# Patient Record
Sex: Male | Born: 1955 | Race: Black or African American | Hispanic: No | Marital: Single | Smoking: Never smoker
Health system: Southern US, Community
[De-identification: ages and names within clinical notes are randomized; demographics above are authoritative.]

## PROBLEM LIST (undated history)

## (undated) DIAGNOSIS — I10 Essential (primary) hypertension: Secondary | ICD-10-CM

## (undated) HISTORY — PX: KNEE SURGERY: SHX244

---

## 1999-02-13 ENCOUNTER — Encounter: Payer: Self-pay | Admitting: Family Medicine

## 1999-02-13 ENCOUNTER — Ambulatory Visit (HOSPITAL_COMMUNITY): Admission: RE | Admit: 1999-02-13 | Discharge: 1999-02-13 | Payer: Self-pay | Admitting: Family Medicine

## 2002-03-10 ENCOUNTER — Ambulatory Visit (HOSPITAL_COMMUNITY): Admission: RE | Admit: 2002-03-10 | Discharge: 2002-03-10 | Payer: Self-pay | Admitting: Family Medicine

## 2002-03-10 ENCOUNTER — Encounter: Payer: Self-pay | Admitting: Family Medicine

## 2002-09-27 ENCOUNTER — Ambulatory Visit (HOSPITAL_BASED_OUTPATIENT_CLINIC_OR_DEPARTMENT_OTHER): Admission: RE | Admit: 2002-09-27 | Discharge: 2002-09-27 | Payer: Self-pay | Admitting: Orthopedic Surgery

## 2009-06-13 ENCOUNTER — Emergency Department (HOSPITAL_COMMUNITY): Admission: EM | Admit: 2009-06-13 | Discharge: 2009-06-13 | Payer: Self-pay | Admitting: Emergency Medicine

## 2010-04-16 ENCOUNTER — Emergency Department (HOSPITAL_COMMUNITY): Admission: EM | Admit: 2010-04-16 | Discharge: 2010-04-16 | Payer: Self-pay | Admitting: Emergency Medicine

## 2010-11-05 ENCOUNTER — Ambulatory Visit (HOSPITAL_COMMUNITY)
Admission: RE | Admit: 2010-11-05 | Discharge: 2010-11-05 | Disposition: A | Discharge status: Home / Self Care | Payer: 59 | Source: Ambulatory Visit | Attending: Family Medicine | Admitting: Family Medicine

## 2010-11-05 ENCOUNTER — Inpatient Hospital Stay (INDEPENDENT_AMBULATORY_CARE_PROVIDER_SITE_OTHER)
Admission: RE | Admit: 2010-11-05 | Discharge: 2010-11-05 | Disposition: A | Discharge status: Home / Self Care | Payer: 59 | Source: Ambulatory Visit | Attending: Family Medicine | Admitting: Family Medicine

## 2010-11-05 DIAGNOSIS — M25569 Pain in unspecified knee: Secondary | ICD-10-CM | POA: Insufficient documentation

## 2010-11-05 DIAGNOSIS — M79609 Pain in unspecified limb: Secondary | ICD-10-CM

## 2010-12-15 LAB — POCT URINALYSIS DIP (DEVICE)
Bilirubin Urine: NEGATIVE
Glucose, UA: NEGATIVE mg/dL
Hgb urine dipstick: NEGATIVE
Nitrite: NEGATIVE
Protein, ur: NEGATIVE mg/dL
Specific Gravity, Urine: 1.025 (ref 1.005–1.030)
Urobilinogen, UA: 0.2 mg/dL (ref 0.0–1.0)
pH: 5 (ref 5.0–8.0)

## 2011-02-15 NOTE — Op Note (Signed)
NAME:  CANDEN, CIESLINSKI                        ACCOUNT NO.:  [id number]   MEDICAL RECORD NO.:  [id number]                   PATIENT TYPE:  AMB   LOCATION:  DSC                                  FACILITY:  [facility]   PHYSICIAN:  John L. Rendall III, M.D.           DATE OF BIRTH:  12/22/1955   DATE OF PROCEDURE:  09/27/2002  DATE OF DISCHARGE:                                 OPERATIVE REPORT   PREOPERATIVE DIAGNOSIS:  Quadriceps tendon rupture, left knee.   POSTOPERATIVE DIAGNOSIS:  Quadriceps tendon rupture, left knee.   OPERATION PERFORMED:  Repair of complex quadriceps tendon rupture, left  knee.   SURGEON:  John L. Rendall, M.D.   ANESTHESIA:  General.   INDICATIONS FOR PROCEDURE:  The patient has a quadriceps tendon rupture from  falling down the stairs.  The vastus medius, intermedius and VMO are torn  off the superior and medial portion of the quadriceps.  There is  delamination of the tendons at this point with still some lateral attachment  of the vastus lateralis and the inferior most margin of the vastus medialis.   DESCRIPTION OF PROCEDURE:  Under general anesthesia, the right leg was  prepared with Betadine and draped as a sterile field.  The leg was elevated  and the tourniquet was used at 350 mm.  Midline incision was made  approximately 5 inches in length.  Dissection was carried through skin and  subcutaneous tissue and the seroma that communicates with the knee was  immediately evacuated.  The rip in the tendon was readily identifiable, but  it is slightly atypical instead of being the massive avulsion from the  superior patella.  There is some delamination with some lateral attachment  still present and medial attachments torn loose.  The tendon ends were  freshened with a 10 blade and tongue blade.  The superior pole of the  patella was exposed with rongeur and curets.  Two drill holes were placed.  A #5 Tycron suture was then placed through drill holes in the  superior  patella after weaving it in a modified Bunnell stitch through the tendon  pulling it down to the patella.  This was then tied with a large knot over  the center of the patella.  Medially and laterally with #1 Tycron suture,  the periphery of the tendon tear is repaired requiring about four sutures  medially and about six sutures laterally pulling together the areas of  delamination and making sure they were intact.  The knee was then flexed 45  degrees.  The repair appears solid.  The subcu was then closed with 2-0  Vicryl.  The skin with clips.  Sterile dressing and knee immobilizer were  applied after first infiltrating the wound with Marcaine with morphine with  epinephrine.  The tourniquet was used for 30 minutes.  John L. Dorothyann Gibbs, M.D.    Renato Gails  D:  09/27/2002  T:  09/27/2002  Job:  [id number]

## 2011-04-03 ENCOUNTER — Inpatient Hospital Stay (INDEPENDENT_AMBULATORY_CARE_PROVIDER_SITE_OTHER)
Admission: RE | Admit: 2011-04-03 | Discharge: 2011-04-03 | Disposition: A | Discharge status: Home / Self Care | Payer: Self-pay | Source: Ambulatory Visit | Attending: Emergency Medicine | Admitting: Emergency Medicine

## 2011-04-03 DIAGNOSIS — M899 Disorder of bone, unspecified: Secondary | ICD-10-CM

## 2011-07-23 ENCOUNTER — Emergency Department (HOSPITAL_COMMUNITY): Payer: 59

## 2011-07-23 ENCOUNTER — Emergency Department (HOSPITAL_COMMUNITY)
Admission: EM | Admit: 2011-07-23 | Discharge: 2011-07-24 | Disposition: A | Discharge status: Home / Self Care | Payer: 59 | Attending: Emergency Medicine | Admitting: Emergency Medicine

## 2011-07-23 ENCOUNTER — Inpatient Hospital Stay (INDEPENDENT_AMBULATORY_CARE_PROVIDER_SITE_OTHER)
Admission: RE | Admit: 2011-07-23 | Discharge: 2011-07-23 | Disposition: A | Discharge status: Home / Self Care | Payer: 59 | Source: Ambulatory Visit | Attending: Family Medicine | Admitting: Family Medicine

## 2011-07-23 DIAGNOSIS — Z862 Personal history of diseases of the blood and blood-forming organs and certain disorders involving the immune mechanism: Secondary | ICD-10-CM | POA: Insufficient documentation

## 2011-07-23 DIAGNOSIS — M25569 Pain in unspecified knee: Secondary | ICD-10-CM | POA: Insufficient documentation

## 2011-07-23 DIAGNOSIS — M79609 Pain in unspecified limb: Secondary | ICD-10-CM | POA: Insufficient documentation

## 2011-07-23 DIAGNOSIS — M7989 Other specified soft tissue disorders: Secondary | ICD-10-CM | POA: Insufficient documentation

## 2011-07-23 DIAGNOSIS — Z8639 Personal history of other endocrine, nutritional and metabolic disease: Secondary | ICD-10-CM | POA: Insufficient documentation

## 2011-07-23 LAB — DIFFERENTIAL
Basophils Absolute: 0 10*3/uL (ref 0.0–0.1)
Basophils Relative: 0 % (ref 0–1)
Eosinophils Absolute: 0.3 10*3/uL (ref 0.0–0.7)
Eosinophils Relative: 2 % (ref 0–5)
Lymphocytes Relative: 32 % (ref 12–46)
Lymphs Abs: 4.2 10*3/uL — ABNORMAL HIGH (ref 0.7–4.0)
Monocytes Absolute: 1.1 10*3/uL — ABNORMAL HIGH (ref 0.1–1.0)
Monocytes Relative: 8 % (ref 3–12)
Neutro Abs: 7.6 10*3/uL (ref 1.7–7.7)
Neutrophils Relative %: 58 % (ref 43–77)

## 2011-07-23 LAB — BASIC METABOLIC PANEL
BUN: 12 mg/dL (ref 6–23)
Calcium: 9.3 mg/dL (ref 8.4–10.5)
GFR calc Af Amer: 81 mL/min — ABNORMAL LOW (ref >90)
GFR calc non Af Amer: 70 mL/min — ABNORMAL LOW (ref >90)
Glucose, Bld: 87 mg/dL (ref 70–99)
Sodium: 141 mEq/L (ref 135–145)

## 2011-07-23 LAB — CBC
Hemoglobin: 16 g/dL (ref 13.0–17.0)
MCH: 28.6 pg (ref 26.0–34.0)
MCHC: 37.4 g/dL — ABNORMAL HIGH (ref 30.0–36.0)
RDW: 14.3 % (ref 11.5–15.5)

## 2011-07-23 LAB — PROTIME-INR: INR: 1.07 (ref 0.00–1.49)

## 2011-07-24 ENCOUNTER — Ambulatory Visit (HOSPITAL_COMMUNITY)
Admission: RE | Admit: 2011-07-24 | Discharge: 2011-07-24 | Disposition: A | Discharge status: Home / Self Care | Payer: 59 | Source: Ambulatory Visit | Attending: Emergency Medicine | Admitting: Emergency Medicine

## 2011-07-24 DIAGNOSIS — M79609 Pain in unspecified limb: Secondary | ICD-10-CM

## 2011-07-24 DIAGNOSIS — M7989 Other specified soft tissue disorders: Secondary | ICD-10-CM

## 2012-12-24 ENCOUNTER — Encounter (HOSPITAL_COMMUNITY): Payer: Self-pay | Admitting: *Deleted

## 2012-12-24 ENCOUNTER — Emergency Department (HOSPITAL_COMMUNITY)
Admission: EM | Admit: 2012-12-24 | Discharge: 2012-12-24 | Disposition: A | Discharge status: Home / Self Care | Payer: No Typology Code available for payment source | Source: Home / Self Care

## 2012-12-24 DIAGNOSIS — S39012A Strain of muscle, fascia and tendon of lower back, initial encounter: Secondary | ICD-10-CM

## 2012-12-24 HISTORY — DX: Essential (primary) hypertension: I10

## 2012-12-24 MED ORDER — NAPROXEN 375 MG PO TABS: 375.0000 mg | ORAL_TABLET | Freq: Two times a day (BID) | ORAL | Status: DC

## 2012-12-24 MED ORDER — KETOROLAC TROMETHAMINE 60 MG/2ML IM SOLN
60.0000 mg | Freq: Once | INTRAMUSCULAR | Status: AC
  Administered 2012-12-24: 60 mg via INTRAMUSCULAR

## 2012-12-24 MED ORDER — HYDROCODONE-ACETAMINOPHEN 7.5-325 MG PO TABS: 1.0000 | ORAL_TABLET | ORAL | Status: DC | PRN

## 2012-12-24 MED ORDER — KETOROLAC TROMETHAMINE 60 MG/2ML IM SOLN
INTRAMUSCULAR | Status: AC
  Filled 2012-12-24: qty 2

## 2012-12-24 MED ORDER — CYCLOBENZAPRINE HCL 5 MG PO TABS: 5.0000 mg | ORAL_TABLET | Freq: Three times a day (TID) | ORAL | Status: DC | PRN

## 2012-12-24 NOTE — ED Provider Notes (Signed)
Medical screening examination/treatment/procedure(s) were performed by non-physician practitioner and as supervising physician I was immediately available for consultation/collaboration.  Tracy Fritz   Maguire Sime, MD 12/24/12 1457 

## 2012-12-24 NOTE — ED Notes (Signed)
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  He has had low back pain since then   He has used warm tub soaks and ice packs without relief

## 2012-12-24 NOTE — ED Provider Notes (Signed)
History     CSN: [id number]  Arrival date & time 12/24/12  1117   First MD Initiated Contact with Patient 12/24/12 1303      Chief Complaint  Patient presents with  . 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 Last week when lifting when he experienced pain in his lower right back. The pain has migrated to the left low back as well. He continued to work and lift during this time the pain and now is back is much worse. He is complaining of pain to the bilateral paralumbar musculature. Worse with trying to sit up, went forward or rotate torso. There is no radiation of neuralgia type pain to the legs. He states that he helps.   Past Medical History  Diagnosis Date  . Hypertension     Past Surgical History  Procedure Laterality Date  . Knee surgery      Family History  Problem Relation Age of Onset  . Stroke Mother   . Cancer Father     History  Substance Use Topics  . Smoking status: Never Smoker   . Smokeless tobacco: Not on file  . Alcohol Use: Yes     Comment: occasionally      Review of Systems  Constitutional: Negative.   Respiratory: Negative.   Gastrointestinal: Negative.   Genitourinary: Negative.   Musculoskeletal:       As per HPI  Skin: Negative.   Neurological: Negative for dizziness, weakness, numbness and headaches.    Allergies  Review of patient's allergies indicates no known allergies.  Home Medications   Current Outpatient Rx  Name  Route  Sig  Dispense  Refill  . cyclobenzaprine (FLEXERIL) 5 MG tablet   Oral   Take 1 tablet (5 mg total) by mouth 3 (three) times daily as needed for muscle spasms.   21 tablet   0   . HYDROcodone-acetaminophen (NORCO) 7.5-325 MG per tablet   Oral   Take 1 tablet by mouth every 4 (four) hours as needed for pain.   15 tablet   0   .  naproxen (NAPROSYN) 375 MG tablet   Oral   Take 1 tablet (375 mg total) by mouth 2 (two) times daily.   20 tablet   0     BP 130/80  Pulse 77  Temp(Src) 98 F (36.7 C) (Oral)  Resp 16  SpO2 98%  Physical Exam  Constitutional: He is oriented to person, place, and time. He appears well-developed and well-nourished.  HENT:  Head: Normocephalic and atraumatic.  Eyes: EOM are normal. Left eye exhibits no discharge.  Neck: Normal range of motion. Neck supple.  Musculoskeletal: He exhibits tenderness.  Tenderness to the bilateral lower paralumbar musculature. No spinal tenderness. Able to move all extremities and ambulate with full weightbearing. Patellar DTRs are 1+ bilaterally negative straight leg raises.  Neurological: He is alert and oriented to person, place, and time. No cranial nerve deficit.  Skin: Skin is warm and dry.  Psychiatric: He has a normal mood and affect.    ED Course  Procedures (including critical care time)  Labs Reviewed - No data to display No results found.   1. Lumbosacral strain, initial encounter       MDM  Apply heat to the back several times during the day. No heavy lifting or bending. Out of work for the next  3 days restricted work no lifting over 25 pounds for the next 4 days. Stretches is demonstrated Flexeril 5 mg up to 3 times a day as needed for muscle accident Zocor 7.5 one every 4 hours when necessary pain #15 Naprosyn 375 twice a day p.c. when necessary pain Follow a primary care doctor as needed.     Hayden Rasmussen, NP 12/24/12 1351  Hayden Rasmussen, NP 12/24/12 1351

## 2019-04-15 ENCOUNTER — Other Ambulatory Visit: Payer: Self-pay

## 2019-04-15 ENCOUNTER — Ambulatory Visit (HOSPITAL_COMMUNITY)
Admission: EM | Admit: 2019-04-15 | Discharge: 2019-04-15 | Disposition: A | Discharge status: Other Acute Inpatient Hospital | Payer: No Typology Code available for payment source

## 2019-04-15 ENCOUNTER — Emergency Department (HOSPITAL_COMMUNITY): Payer: No Typology Code available for payment source

## 2019-04-15 ENCOUNTER — Encounter (HOSPITAL_COMMUNITY): Payer: Self-pay | Admitting: Emergency Medicine

## 2019-04-15 ENCOUNTER — Inpatient Hospital Stay (HOSPITAL_COMMUNITY)
Admission: EM | Admit: 2019-04-15 | Discharge: 2019-04-20 | DRG: 177 | Disposition: A | Discharge status: Home / Self Care | Payer: No Typology Code available for payment source | Attending: Internal Medicine | Admitting: Internal Medicine

## 2019-04-15 DIAGNOSIS — U071 COVID-19: Secondary | ICD-10-CM | POA: Diagnosis not present

## 2019-04-15 DIAGNOSIS — N400 Enlarged prostate without lower urinary tract symptoms: Secondary | ICD-10-CM

## 2019-04-15 DIAGNOSIS — E669 Obesity, unspecified: Secondary | ICD-10-CM | POA: Diagnosis not present

## 2019-04-15 DIAGNOSIS — J1289 Other viral pneumonia: Secondary | ICD-10-CM | POA: Diagnosis not present

## 2019-04-15 DIAGNOSIS — M109 Gout, unspecified: Secondary | ICD-10-CM

## 2019-04-15 DIAGNOSIS — R0902 Hypoxemia: Secondary | ICD-10-CM | POA: Diagnosis not present

## 2019-04-15 DIAGNOSIS — J9601 Acute respiratory failure with hypoxia: Secondary | ICD-10-CM | POA: Diagnosis not present

## 2019-04-15 DIAGNOSIS — R3914 Feeling of incomplete bladder emptying: Secondary | ICD-10-CM

## 2019-04-15 DIAGNOSIS — J1282 Pneumonia due to coronavirus disease 2019: Secondary | ICD-10-CM | POA: Diagnosis present

## 2019-04-15 DIAGNOSIS — Z6834 Body mass index (BMI) 34.0-34.9, adult: Secondary | ICD-10-CM | POA: Diagnosis not present

## 2019-04-15 DIAGNOSIS — I10 Essential (primary) hypertension: Secondary | ICD-10-CM | POA: Diagnosis not present

## 2019-04-15 DIAGNOSIS — N401 Enlarged prostate with lower urinary tract symptoms: Secondary | ICD-10-CM | POA: Diagnosis not present

## 2019-04-15 LAB — CBC WITH DIFFERENTIAL/PLATELET
Abs Immature Granulocytes: 1.1 10*3/uL — ABNORMAL HIGH (ref 0.00–0.07)
Band Neutrophils: 5 %
Basophils Absolute: 0 10*3/uL (ref 0.0–0.1)
Basophils Relative: 0 %
Eosinophils Absolute: 0 10*3/uL (ref 0.0–0.5)
Eosinophils Relative: 0 %
HCT: 45.4 % (ref 39.0–52.0)
Hemoglobin: 16 g/dL (ref 13.0–17.0)
Lymphocytes Relative: 28 %
Lymphs Abs: 4.3 10*3/uL — ABNORMAL HIGH (ref 0.7–4.0)
MCH: 28.4 pg (ref 26.0–34.0)
MCHC: 35.2 g/dL (ref 30.0–36.0)
MCV: 80.5 fL (ref 80.0–100.0)
Metamyelocytes Relative: 6 %
Monocytes Absolute: 0.9 10*3/uL (ref 0.1–1.0)
Monocytes Relative: 6 %
Myelocytes: 1 %
Neutro Abs: 9 10*3/uL — ABNORMAL HIGH (ref 1.7–7.7)
Neutrophils Relative %: 54 %
Platelets: 237 10*3/uL (ref 150–400)
RBC: 5.64 MIL/uL (ref 4.22–5.81)
RDW: 14.9 % (ref 11.5–15.5)
WBC: 15.2 10*3/uL — ABNORMAL HIGH (ref 4.0–10.5)
nRBC: 0.6 % — ABNORMAL HIGH (ref 0.0–0.2)

## 2019-04-15 LAB — BASIC METABOLIC PANEL
Anion gap: 14 (ref 5–15)
BUN: 15 mg/dL (ref 8–23)
CO2: 26 mmol/L (ref 22–32)
Calcium: 9.4 mg/dL (ref 8.9–10.3)
Chloride: 99 mmol/L (ref 98–111)
Creatinine, Ser: 1.36 mg/dL — ABNORMAL HIGH (ref 0.61–1.24)
GFR calc Af Amer: >60 mL/min (ref >60)
GFR calc non Af Amer: 55 mL/min — ABNORMAL LOW (ref >60)
Glucose, Bld: 110 mg/dL — ABNORMAL HIGH (ref 70–99)
Potassium: 4.2 mmol/L (ref 3.5–5.1)
Sodium: 139 mmol/L (ref 135–145)

## 2019-04-15 LAB — PROCALCITONIN: Procalcitonin: <0.1 ng/mL

## 2019-04-15 LAB — HEPATIC FUNCTION PANEL
ALT: 53 U/L — ABNORMAL HIGH (ref 0–44)
AST: 52 U/L — ABNORMAL HIGH (ref 15–41)
Albumin: 3 g/dL — ABNORMAL LOW (ref 3.5–5.0)
Alkaline Phosphatase: 56 U/L (ref 38–126)
Bilirubin, Direct: 0.1 mg/dL (ref 0.0–0.2)
Indirect Bilirubin: 0.7 mg/dL (ref 0.3–0.9)
Total Bilirubin: 0.8 mg/dL (ref 0.3–1.2)
Total Protein: 6.7 g/dL (ref 6.5–8.1)

## 2019-04-15 LAB — BRAIN NATRIURETIC PEPTIDE: B Natriuretic Peptide: 26.6 pg/mL (ref 0.0–100.0)

## 2019-04-15 LAB — SARS CORONAVIRUS 2 BY RT PCR (HOSPITAL ORDER, PERFORMED IN ~~LOC~~ HOSPITAL LAB): SARS Coronavirus 2: POSITIVE — AB

## 2019-04-15 LAB — FERRITIN: Ferritin: 1151 ng/mL — ABNORMAL HIGH (ref 24–336)

## 2019-04-15 LAB — TROPONIN I (HIGH SENSITIVITY)
Troponin I (High Sensitivity): 13 ng/L (ref <18)
Troponin I (High Sensitivity): 11 ng/L (ref <18)

## 2019-04-15 LAB — CK: Total CK: 76 U/L (ref 49–397)

## 2019-04-15 LAB — LACTATE DEHYDROGENASE: LDH: 440 U/L — ABNORMAL HIGH (ref 98–192)

## 2019-04-15 LAB — C-REACTIVE PROTEIN: CRP: 7.9 mg/dL — ABNORMAL HIGH (ref <1.0)

## 2019-04-15 LAB — D-DIMER, QUANTITATIVE (NOT AT ARMC): D-Dimer, Quant: 1.92 ug/mL-FEU — ABNORMAL HIGH (ref 0.00–0.50)

## 2019-04-15 MED ORDER — ACETAMINOPHEN 325 MG PO TABS: 650.0000 mg | ORAL_TABLET | Freq: Four times a day (QID) | ORAL | Status: DC | PRN

## 2019-04-15 MED ORDER — SODIUM CHLORIDE 0.9 % IV SOLN
100.0000 mg | INTRAVENOUS | Status: AC
  Administered 2019-04-16 – 2019-04-19 (×4): 100 mg via INTRAVENOUS
  Filled 2019-04-15 (×4): qty 20

## 2019-04-15 MED ORDER — ENSURE ENLIVE PO LIQD
237.0000 mL | Freq: Two times a day (BID) | ORAL | Status: DC
  Administered 2019-04-16 – 2019-04-20 (×8): 237 mL via ORAL

## 2019-04-15 MED ORDER — METHYLPREDNISOLONE SODIUM SUCC 125 MG IJ SOLR
60.0000 mg | Freq: Four times a day (QID) | INTRAMUSCULAR | Status: DC
  Administered 2019-04-15 – 2019-04-17 (×7): 60 mg via INTRAVENOUS
  Filled 2019-04-15 (×7): qty 2

## 2019-04-15 MED ORDER — ENOXAPARIN SODIUM 40 MG/0.4ML ~~LOC~~ SOLN
40.0000 mg | SUBCUTANEOUS | Status: DC
  Administered 2019-04-15: 40 mg via SUBCUTANEOUS
  Filled 2019-04-15: qty 0.4

## 2019-04-15 MED ORDER — SODIUM CHLORIDE 0.9 % IV SOLN
500.0000 mg | Freq: Once | INTRAVENOUS | Status: AC
  Administered 2019-04-15: 500 mg via INTRAVENOUS
  Filled 2019-04-15: qty 500

## 2019-04-15 MED ORDER — SODIUM CHLORIDE 0.9 % IV SOLN
1.0000 g | Freq: Once | INTRAVENOUS | Status: AC
  Administered 2019-04-15: 1 g via INTRAVENOUS
  Filled 2019-04-15: qty 10

## 2019-04-15 MED ORDER — SODIUM CHLORIDE 0.9 % IV SOLN
200.0000 mg | Freq: Once | INTRAVENOUS | Status: AC
  Administered 2019-04-15: 200 mg via INTRAVENOUS
  Filled 2019-04-15: qty 40

## 2019-04-15 NOTE — H&P (Signed)
History and Physical:    Tracy Fritz   ZOX:[id number] DOB: 01/09/1956 DOA: 04/15/2019  Referring MD/provider: Dr Hyacinth MeekerMiller  [facility]   Patient coming from: Home  Chief Complaint: Shortness of breath  History of Present Illness:   Tracy Fritz is an 63 y.o. male with past medical history significant for hypertension who was in his usual state of good health until the day he retired which was 6 days ago when he developed onset of fatigue.  He notes that he was unable to enjoy his food because the food tasted very salty.  Over the next several days patient developed profound anorexia, nonproductive cough and increased weakness About 3 days ago developed shortness of breath.  Patient notes he is very active at baseline however he has been mostly sitting around his house and as of 3 days ago he noted he was short of breath just sitting around.  Of note his partner also has been feeling unwell and she was admitted to the hospital yesterday with pneumonia.  Her COVID test is not yet available.  Patient denies any fevers or chills or sweats.  He denies any chest pain.  Does admit to some difficulty sleeping because he states when he sleeps on his side he feels a heaviness on his chest and has to turn over to the other side for the heaviness to ease off.  Patient notes his cough is nonproductive although he does have some chest discomfort with coughing.  ED Course:  The patient was noted to have a low-grade temperature of 99, oxygen saturation of 75 on room air which improved to 90% on 2 L and 99% on 4 L.  Of note patient does appear to have somewhat of a leukocytosis with WBC 15 with no left shift however of note he had a white count of 13 in 2012 (which is the last blood test we have for him) of his baseline WBC may be somewhat elevated.  [place] appears normal.. X-ray shows bilateral infiltrate.  COVID test is positive.  Procalcitonin is pending.  ROS:   ROS    Review of Systems: General: No fever, chills, weight changes Skin: No rashes, lesions, wounds Endocrine: no heat/cold intolerance, no polyuria Respiratory: Positive cough,, shortness of breath, no hemoptysis Cardiovascular: No palpitations, chest pain GI: No nausea, vomiting, diarrhea, constipation GU: No dysuria, increased frequency CNS: No numbness, dizziness, headache Musculoskeletal: No back pain, joint pain Blood/lymphatics: No easy bruising, bleeding Mood/affect: No anxiety/depression    Past Medical History:   Past Medical History:  Diagnosis Date  . Hypertension     Past Surgical History:   Past Surgical History:  Procedure Laterality Date  . KNEE SURGERY      Social History:   Social History   Socioeconomic History  . Marital status: Single    Spouse name: Not on file  . Number of children: Not on file  . Years of education: Not on file  . Highest education level: Not on file  Occupational History  . Not on file  Social Needs  . Financial resource strain: Not on file  . Food insecurity    Worry: Not on file    Inability: Not on file  . Transportation needs    Medical: Not on file    Non-medical: Not on file  Tobacco Use  . Smoking status: Never Smoker  Substance and Sexual Activity  . Alcohol use: Yes    Comment: occasionally  . Drug use:  No  . Sexual activity: Not on file  Lifestyle  . Physical activity    Days per week: Not on file    Minutes per session: Not on file  . Stress: Not on file  Relationships  . Social Musicianconnections    Talks on phone: Not on file    Gets together: Not on file    Attends religious service: Not on file    Active member of club or organization: Not on file    Attends meetings of clubs or organizations: Not on file    Relationship status: Not on file  . Intimate partner violence    Fear of current or ex partner: Not on file    Emotionally abused: Not on file    Physically abused: Not on file    Forced sexual  activity: Not on file  Other Topics Concern  . Not on file  Social History Narrative  . Not on file    Allergies   Patient has no known allergies.  Family history:   Family History  Problem Relation Age of Onset  . Stroke Mother   . Cancer Father     Current Medications:   Prior to Admission medications   Medication Sig Start Date End Date Taking? Authorizing Provider  PRESCRIPTION MEDICATION Take 1 tablet by mouth daily. Blood pressure medication   Yes [provider]  PRESCRIPTION MEDICATION Take 1 tablet by mouth daily. Gout medication   Yes [provider]  PRESCRIPTION MEDICATION Take 1 tablet by mouth daily. Prostate medication   Yes [provider]    Physical Exam:   Vitals:   04/15/19 1300 04/15/19 1315 04/15/19 1330 04/15/19 1400  BP: 125/86  120/85 126/88  Pulse: 77   80  Resp: (!) 37 (!) 35 (!) 43 (!) 31  Temp:      TempSrc:      SpO2: 93%   (!) (P) 89%  Weight:      Height:         Physical Exam: Blood pressure 126/88, pulse 80, temperature 99 F (37.2 C), temperature source Oral, resp. rate (!) 31, height 5\' 9"  (1.753 m), weight 105.2 kg, SpO2 (!) (P) 89 %. Gen: Relatively well-appearing man sitting up in stretcher in no acute respiratory distress.  He no longer has any tachypnea that was previously described.  He is able to speak in full sentences without difficulty. Eyes: Sclerae anicteric. Conjunctiva mildly injected. Chest: Moderately good air entry bilaterally with rales at bases bilaterally. CV: Distant, regular, no audible murmurs. Abdomen: NABS, soft, nondistended, nontender. No tenderness to light or deep palpation. No rebound, no guarding. Extremities: No edema.  Skin: Warm and dry. No rashes, lesions or wounds. Neuro: Alert and oriented times 3; grossly nonfocal. Psych: Patient is cooperative, logical and coherent with appropriate mood and affect.  Data Review:    Labs: Basic Metabolic Panel: Recent Labs   Lab 04/15/19 1114  NA 139  K 4.2  CL 99  CO2 26  GLUCOSE 110*  BUN 15  CREATININE 1.36*  CALCIUM 9.4   Liver Function Tests: No results for input(s): AST, ALT, ALKPHOS, BILITOT, PROT, ALBUMIN in the last 168 hours. No results for input(s): LIPASE, AMYLASE in the last 168 hours. No results for input(s): AMMONIA in the last 168 hours. CBC: Recent Labs  Lab 04/15/19 1114  WBC 15.2*  NEUTROABS 9.0*  HGB 16.0  HCT 45.4  MCV 80.5  PLT 237   Cardiac Enzymes: Recent Labs  Lab 04/15/19 1222  CKTOTAL 76    BNP (last 3 results) No results for input(s): PROBNP in the last 8760 hours. CBG: No results for input(s): GLUCAP in the last 168 hours.  Urinalysis    Component Value Date/Time   LABSPEC 1.025 04/16/2010 2033   PHURINE 5.0 04/16/2010 2033   GLUCOSEU NEGATIVE 04/16/2010 2033   HGBUR NEGATIVE 04/16/2010 2033   [place] NEGATIVE 04/16/2010 2033   KETONESUR TRACE (A) 04/16/2010 2033   PROTEINUR NEGATIVE 04/16/2010 2033   UROBILINOGEN 0.2 04/16/2010 2033   NITRITE NEGATIVE 04/16/2010 2033   LEUKOCYTESUR  04/16/2010 2033    NEGATIVE Biochemical Testing Only. Please order routine urinalysis from main lab if confirmatory testing is needed.      Radiographic Studies: Dg Chest Port 1 View  Result Date: 04/15/2019 CLINICAL DATA:  Shortness of breath EXAM: PORTABLE CHEST 1 VIEW COMPARISON:  None. FINDINGS: Cardiac shadow is enlarged but accentuated by the portable technique. Mild bibasilar infiltrates are seen. No sizable effusion is noted. No bony abnormality is noted. IMPRESSION: Bibasilar infiltrates. Electronically Signed   By: Inez Catalina M.D.   On: 04/15/2019 12:02    EKG: Independently reviewed.  Sinus rhythm at 75.  Normal intervals.  Normal axis.  T wave inversions 3 and V1 isolated.  No acute ST-T wave changes.     Assessment/Plan:   Principal Problem:   Pneumonia due to COVID-19 virus Active Problems:   Essential hypertension   BPH (benign  prostatic hyperplasia)   Gout  Otherwise relatively healthy 63 year old man presents with COVID-19 pneumonia.  He does have hypoxia on room air and infiltrates on chest x-ray.  COVID 19 PNEUMONIA  We will treat with steroids and Remdesivir Patient received 1 dose of azithromycin and ceftriaxone in the ED Calcitonin is still pending CRP is 8, d-dimer is almost 2.  Of note ferritin is 1151.  HTN We will hold antihypertensives for now until he is adequately fluid resuscitated  BPH Continue Flomax  GOUT No evidence for acute flare    Other information:   DVT prophylaxis: Lovenox ordered. Code Status: Full code. Family Communication: Patient spoke with his partner who is in the hospital Disposition Plan: Home Consults called: None Admission status: Observation  The medical decision making is of moderate complexity, therefore this is a level 2 visit.  Dewaine Oats Tublu Ervey Fallin [facility]  If 7PM-7AM, please contact night-coverage [email or web address] Password [facility] 04/15/2019, 2:46 PM

## 2019-04-15 NOTE — Progress Notes (Signed)
Patient with minimal complaints this evening. Endorses mild SOB and intermittent chest discomfort. VSS, satting 92-94% on 4L [place]. Educated on use of incentive spirometer and flutter valve. Demonstrated use of both instruments and achieved 1000 on IS X3. Educated on importance of proning and patient agreeable to attempt this evening. All questions answered and patient understands plan of care. Called sister Rise Paganini and left voicemail.

## 2019-04-15 NOTE — ED Provider Notes (Signed)
Medical screening examination/treatment/procedure(s) were conducted as a shared visit with non-physician practitioner(s) and myself.  I personally evaluated the patient during the encounter.  Clinical Impression:   Final diagnoses:  Pneumonia due to COVID-19 virus  Hypoxia    The patient is a very pleasant 63 year old male, presents with increasing shortness of breath, tachypnea, coughing after being exposed to his significant other who was recently admitted for pneumonia to an outside hospital.  He is unaware of that person was positive for COVID-19.  On exam the patient does not fact have tachypnea, his oxygen saturations are below 80% on room air however with nasal cannula he he comes up to 89% and feels better.  On exam he does have bilateral rales at the bases and is mildly tachypneic but speaking in short and sentences.  There is no edema of the legs, labs reviewed which are all consistent with coronavirus pneumonia including inflammatory markers.  He will need to be admitted, he will need persistent oxygen therapy to make sure he does not become hypoxic and suffer the sequelae of that.  The patient is critically ill with progressive pneumonia.  He will need to be admitted to a higher level of care at the coronavirus hospital at [facility].  Consultation with hospitalist pending.  Case discussed with hospitalist, Dr. Jamse Arn who will admit the patient to the hospital or transfer based on risk for COVID  CRITICAL CARE Performed by: Johnna Acosta Total critical care time: 35 minutes Critical care time was exclusive of separately billable procedures and treating other patients. Critical care was necessary to treat or prevent imminent or life-threatening deterioration. Critical care was time spent personally by me on the following activities: development of treatment plan with patient and/or surrogate as well as nursing, discussions with consultants, evaluation of patient's response to  treatment, examination of patient, obtaining history from patient or surrogate, ordering and performing treatments and interventions, ordering and review of laboratory studies, ordering and review of radiographic studies, pulse oximetry and re-evaluation of patient's condition.    Noemi Chapel, MD 04/17/19 [phone]

## 2019-04-15 NOTE — ED Notes (Signed)
Patient is being discharged from the [facility] [place] and sent to the Emergency Department via wheelchair by staff. Per Tressia Miners, NP, patient is stable but in need of higher level of care due to desaturation, tachypnea, and shortness of breath. Patient is aware and verbalizes understanding of plan of care.  Vitals:   04/15/19 1027 04/15/19 1032  BP: 132/87   Pulse: 87   Resp: (!) 28   Temp: 98.7 F (37.1 C)   SpO2: (!) 83% 94%

## 2019-04-15 NOTE — Progress Notes (Signed)
Pt arrived to unit at 1828 via [facility], alert and oriented, ambulatory, in no distress. Wearing  at 4L.

## 2019-04-15 NOTE — Progress Notes (Signed)
Pharmacy Note - Remdesivir Dosing  O:  ALT: 53 CXR: bibasilar infiltrates SpO2: 98% on 4L [place]   A/P:  Patient meets criteria for remdesivir.  Begin remdesivir 200 mg IV x 1, followed by 100 mg IV daily x 4 days  Monitor ALT, clinical progress  Peggyann Juba, PharmD, [place] [phone] 04/15/2019 6:25 PM

## 2019-04-15 NOTE — ED Notes (Signed)
O2 nasal canula dropped down to 2lit-will continue to monitor

## 2019-04-15 NOTE — ED Notes (Addendum)
O2 is about 79-83% on room air Patient is placed on 3lit [place] and HOB elevated

## 2019-04-15 NOTE — ED Provider Notes (Signed)
[facility] EMERGENCY DEPARTMENT Provider Note   CSN: [id number] Arrival date & time: 04/15/19  1042    History   Chief Complaint Chief Complaint  Patient presents with  . Shortness of Breath    HPI Tracy Fritz is a 63 y.o. male with a PMH of HTN presenting with constant shortness of breath onset 6 days ago. Patient arrived from urgent care due to low oxygen saturations. Patient reports shortness of breath is worse with laying down and better with ambulation. Patient also reports intermittent right sided non radiating chest pressure and states last episode was today at 4am. Patient reports an intermittent cough and congestion. Patient reports his friend has been sick with pneumonia. Patient denies any recent travel or known COVID-19 exposures. Patient denies fever, nausea, vomiting, leg edema/pain, hx of DVT/PE, or abdominal pain.      HPI  Past Medical History:  Diagnosis Date  . Hypertension     There are no active problems to display for this patient.   Past Surgical History:  Procedure Laterality Date  . KNEE SURGERY          Home Medications    Prior to Admission medications   Not on File    Family History Family History  Problem Relation Age of Onset  . Stroke Mother   . Cancer Father     Social History Social History   Tobacco Use  . Smoking status: Never Smoker  Substance Use Topics  . Alcohol use: Yes    Comment: occasionally  . Drug use: No     Allergies   Patient has no known allergies.   Review of Systems Review of Systems  Constitutional: Negative for chills, diaphoresis, fatigue, fever and unexpected weight change.  HENT: Positive for congestion. Negative for rhinorrhea, sore throat and trouble swallowing.   Eyes: Negative for visual disturbance.  Respiratory: Positive for cough and shortness of breath. Negative for chest tightness, wheezing and stridor.   Cardiovascular: Negative for chest pain,  palpitations and leg swelling.  Gastrointestinal: Negative for abdominal pain, blood in stool, diarrhea, nausea and vomiting.  Endocrine: Negative for cold intolerance and heat intolerance.  Genitourinary: Negative for dysuria.  Musculoskeletal: Negative for myalgias.  Skin: Negative for pallor and rash.  Allergic/Immunologic: Negative for environmental allergies, food allergies and immunocompromised state.  Neurological: Negative for dizziness, syncope, speech difficulty, weakness, light-headedness and numbness.  Psychiatric/Behavioral: The patient is not nervous/anxious.     Physical Exam Updated Vital Signs BP 120/85   Pulse 77   Temp 99 F (37.2 C) (Oral)   Resp (!) 43   Ht 5\' 9"  (1.753 m)   Wt 105.2 kg   SpO2 93%   BMI 34.26 kg/m   Physical Exam Vitals signs and nursing note reviewed.  Constitutional:      General: He is not in acute distress.    Appearance: He is well-developed. He is not diaphoretic.  HENT:     Head: Normocephalic and atraumatic.     Mouth/Throat:     Mouth: Mucous membranes are moist.     Pharynx: Oropharynx is clear. No pharyngeal swelling or oropharyngeal exudate.  Neck:     Musculoskeletal: Normal range of motion and neck supple.     Vascular: No JVD.  Cardiovascular:     Rate and Rhythm: Normal rate and regular rhythm.     Pulses: Normal pulses.          Radial pulses are 2+ on the right side  and 2+ on the left side.       Dorsalis pedis pulses are 2+ on the right side and 2+ on the left side.     Heart sounds: Normal heart sounds. No murmur. No friction rub. No gallop.   Pulmonary:     Effort: Pulmonary effort is normal. Tachypnea present. No accessory muscle usage or respiratory distress.     Breath sounds: Examination of the right-middle field reveals rales. Examination of the left-middle field reveals rales. Examination of the right-lower field reveals rales. Examination of the left-lower field reveals rales. Rales present. No decreased  breath sounds, wheezing or rhonchi.     Comments: Patient is speaking in full sentences without difficulty on 3L of oxygen.  Chest:     Chest wall: No tenderness.  Abdominal:     Palpations: Abdomen is soft.     Tenderness: There is no abdominal tenderness.  Musculoskeletal: Normal range of motion.     Right lower leg: He exhibits no tenderness. No edema.     Left lower leg: He exhibits no tenderness. No edema.  Skin:    General: Skin is warm.     Capillary Refill: Capillary refill takes less than 2 seconds.     Coloration: Skin is not pale.     Findings: No rash.  Neurological:     Mental Status: He is alert.     ED Treatments / Results  Labs (all labs ordered are listed, but only abnormal results are displayed) Labs Reviewed  SARS CORONAVIRUS 2 (HOSPITAL ORDER, [place] [facility]) - Abnormal; Notable for the following components:      Result Value   SARS Coronavirus 2 POSITIVE (*)    All other components within normal limits  BASIC METABOLIC PANEL - Abnormal; Notable for the following components:   Glucose, Bld 110 (*)    Creatinine, Ser 1.36 (*)    GFR calc non Af Amer 55 (*)    All other components within normal limits  CBC WITH DIFFERENTIAL/PLATELET - Abnormal; Notable for the following components:   WBC 15.2 (*)    nRBC 0.6 (*)    Neutro Abs 9.0 (*)    Lymphs Abs 4.3 (*)    Abs Immature Granulocytes 1.10 (*)    All other components within normal limits  LACTATE DEHYDROGENASE - Abnormal; Notable for the following components:   LDH 440 (*)    All other components within normal limits  FERRITIN - Abnormal; Notable for the following components:   Ferritin 1,151 (*)    All other components within normal limits  C-REACTIVE PROTEIN - Abnormal; Notable for the following components:   CRP 7.9 (*)    All other components within normal limits  D-DIMER, QUANTITATIVE (NOT AT [facility]) - Abnormal; Notable for the following components:   D-Dimer, Quant 1.92 (*)     All other components within normal limits  BRAIN NATRIURETIC PEPTIDE  CK  PATHOLOGIST SMEAR REVIEW  TROPONIN I (HIGH SENSITIVITY)  TROPONIN I (HIGH SENSITIVITY)    EKG EKG Interpretation  Date/Time:  Thursday April 15 2019 10:50:54 EDT Ventricular Rate:  83 PR Interval:    QRS Duration: 88 QT Interval:  395 QTC Calculation: 465 R Axis:   -44 Text Interpretation:  Sinus rhythm Left anterior fascicular block No old tracing to compare Confirmed by Noemi Chapel [phone]) on 04/15/2019 10:57:32 AM   Radiology Dg Chest Port 1 View  Result Date: 04/15/2019 CLINICAL DATA:  Shortness of breath EXAM: PORTABLE CHEST  1 VIEW COMPARISON:  None. FINDINGS: Cardiac shadow is enlarged but accentuated by the portable technique. Mild bibasilar infiltrates are seen. No sizable effusion is noted. No bony abnormality is noted. IMPRESSION: Bibasilar infiltrates. Electronically Signed   By: Alcide CleverMark  Lukens M.D.   On: 04/15/2019 12:02    Procedures .Critical Care Performed by: Leretha DykesHernandez, Jenson Beedle P, PA-C Authorized by: Leretha DykesHernandez, Hydeia Mcatee P, PA-C   Critical care provider statement:    Critical care time (minutes):  37   Critical care was time spent personally by me on the following activities:  Discussions with consultants, evaluation of patient's response to treatment, examination of patient, ordering and performing treatments and interventions, ordering and review of laboratory studies, ordering and review of radiographic studies, pulse oximetry, re-evaluation of patient's condition, obtaining history from patient or surrogate and review of old charts   (including critical care time)  Medications Ordered in ED Medications  cefTRIAXone (ROCEPHIN) 1 g in sodium chloride 0.9 % 100 mL IVPB (0 g Intravenous Stopped 04/15/19 1330)  azithromycin (ZITHROMAX) 500 mg in sodium chloride 0.9 % 250 mL IVPB (0 mg Intravenous Stopped 04/15/19 1356)     Initial Impression / Assessment and Plan / ED Course  I have reviewed  the triage vital signs and the nursing notes.  Pertinent labs & imaging results that were available during my care of the patient were reviewed by me and considered in my medical decision making (see chart for details).  Clinical Course as of Apr 14 1408  Thu Apr 15, 2019  1149 Leukocytosis noted over 15.2.   WBC(!): 15.2 [AH]  1210 Bibasilar infiltrates noted on CXR. Images reviewed by me.  DG Chest Port 1 View [AH]  1341 Initial troponin is 13.   Troponin I (High Sensitivity) [AH]  1341 BNP is within normal limits.   Brain natriuretic peptide [AH]  1358 COVID-19 positive.   SARS Coronavirus 2 ([facility] Hosp Order(!) [AH]    Clinical Course User Index [AH] Leretha DykesHernandez, Aul Mangieri P, [place]PA-C      Patient presents with shortness of breath. Vitals, labs, and imaging reviewed. Leukocytosis noted at 15.2. Patient is afebrile. CXR reveals bibasilar infiltrates. Patient has required 3L of oxygen while in the ER. Oxygen saturations dropped to 79% without supplemental oxygen. Provided antibiotics due to pneumonia. COVID-19 test was positive. EKG without acute changes. Troponin is 13. D dimer, CRP, Lactate dehydrogenase, and ferritin elevated consistent with COVID-19. BNP within normal limits. Patient will require admission for COVID-19 pneumonia and hypoxia. Hospitalist was consulted and has agreed to admit patient.   Tracy PouchHerman J Asfaw was evaluated in Emergency Department on 04/15/2019 for the symptoms described in the history of present illness. He was evaluated in the context of the global COVID-19 pandemic, which necessitated consideration that the patient might be at risk for infection with the SARS-CoV-2 virus that causes COVID-19. Institutional protocols and algorithms that pertain to the evaluation of patients at risk for COVID-19 are in a state of rapid change based on information released by regulatory bodies including the CDC and federal and state organizations.  These policies and algorithms were followed during the patient's care in the ED.  Findings and plan of care discussed with supervising physician Dr. Hyacinth MeekerMiller who personally evaluated and examined this patient.  Final Clinical Impressions(s) / ED Diagnoses   Final diagnoses:  Pneumonia due to COVID-19 virus  Hypoxia    ED Discharge Orders    None       Ardyth HarpsHernandez,  Marrianne Moodna P, PA-C 04/15/19 1409    Eber HongMiller, Brian, MD 04/17/19 [phone]

## 2019-04-15 NOTE — Discharge Instructions (Addendum)
Please go to the ER for further evaluation due to low oxygen levels and possible COVID

## 2019-04-15 NOTE — ED Notes (Signed)
[facility] arrived to transport patient

## 2019-04-15 NOTE — ED Triage Notes (Signed)
Pt here for increased SOB x 3 days; pt sts unable to lay flat for sleep; pt noted sats 75% initially rising to 83% on RA with rest; pt tachyonic; pt sts cough and pain with cough; pt placed on 4L with O2 94%

## 2019-04-15 NOTE — ED Provider Notes (Signed)
[place]    CSN: [id number] Arrival date & time: 04/15/19  1696     History   Chief Complaint Chief Complaint  Patient presents with  . Shortness of Breath    HPI Tracy Fritz is a 64 y.o. male.   Patient is a 63 year old male with past medical history of hypertension.  He presents today for increased shortness of breath over the past 3 days.  Reporting he has not felt well since 04/09/19.  He has had mild cough and chest congestion.  He has been unable to lie flat with sleeping.  He has had some dizziness at times and feels like he has to bend over to catch his breath.  Initially upon arrival on room air patient saturations were 75% and then increased to 83% with sitting.  Patient placed on oxygen at 4 L and saturations improved to 94%.  Denies any associated fever, chills, body aches.  He has had some loss of taste and smell.  Unsure of any COVID exposures.    ROS per HPI      Past Medical History:  Diagnosis Date  . Hypertension     There are no active problems to display for this patient.   Past Surgical History:  Procedure Laterality Date  . KNEE SURGERY         Home Medications    Prior to Admission medications   Not on File    Family History Family History  Problem Relation Age of Onset  . Stroke Mother   . Cancer Father     Social History Social History   Tobacco Use  . Smoking status: Never Smoker  Substance Use Topics  . Alcohol use: Yes    Comment: occasionally  . Drug use: No     Allergies   Patient has no known allergies.   Review of Systems Review of Systems   Physical Exam Triage Vital Signs ED Triage Vitals  Enc Vitals Group     BP 04/15/19 1027 132/87     Pulse Rate 04/15/19 1027 87     Resp 04/15/19 1027 (!) 28     Temp 04/15/19 1027 98.7 F (37.1 C)     Temp Source 04/15/19 1027 Oral     SpO2 04/15/19 1027 (!) 83 %     Weight --      Height --      Head Circumference --      Peak Flow --     Pain Score 04/15/19 1028 6     Pain Loc --      Pain Edu? --      Excl. in [place]? --    No data found.  Updated Vital Signs BP 132/87 (BP Location: Right Arm)   Pulse 87   Temp 98.7 F (37.1 C) (Oral)   Resp (!) 28   SpO2 94%   Visual Acuity Right Eye Distance:   Left Eye Distance:   Bilateral Distance:    Right Eye Near:   Left Eye Near:    Bilateral Near:     Physical Exam Vitals signs and nursing note reviewed.  Constitutional:      Appearance: Normal appearance.  HENT:     Head: Normocephalic and atraumatic.     Nose: Nose normal.  Eyes:     Conjunctiva/sclera: Conjunctivae normal.  Neck:     Musculoskeletal: Normal range of motion.  Cardiovascular:     Rate and Rhythm: Normal rate and regular rhythm.  Pulses: Normal pulses.     Heart sounds: Normal heart sounds.  Pulmonary:     Comments: Decreased lung sounds in all fields Musculoskeletal: Normal range of motion.  Skin:    General: Skin is warm and dry.  Neurological:     Mental Status: He is alert.  Psychiatric:        Mood and Affect: Mood normal.      UC Treatments / Results  Labs (all labs ordered are listed, but only abnormal results are displayed) Labs Reviewed - No data to display  EKG   Radiology No results found.  Procedures Procedures (including critical care time)  Medications Ordered in UC Medications - No data to display  Initial Impression / Assessment and Plan / UC Course  I have reviewed the triage vital signs and the nursing notes.  Pertinent labs & imaging results that were available during my care of the patient were reviewed by me and considered in my medical decision making (see chart for details).     Sending patient to the ER for further evaluation of hypoxia and high suspicion for COVID Final Clinical Impressions(s) / UC Diagnoses   Final diagnoses:  None     Discharge Instructions     Please go to the ER for further evaluation due to low oxygen levels  and possible COVID    ED Prescriptions    None     Controlled Substance Prescriptions [place] Controlled Substance Registry consulted? Not Applicable   Janace ArisBast, Briar Sword A, NP 04/15/19 1116

## 2019-04-15 NOTE — ED Triage Notes (Signed)
Patient arrived from Urgent Care with reports that he went to there for Shortness of breathe, but he was told that his O2 level were too low.  PT reports he was having "too many cat naps," unable to sleep flat, felt like he had congestion but "nothing would come up when I cough." All these symptoms have been going on for more than a week.

## 2019-04-15 NOTE — ED Notes (Signed)
PT's O2 sat is between 85-88 on 2 lit-[place] Oxygen titrated up to 4lit [place]

## 2019-04-15 NOTE — ED Notes (Signed)
Got patient undress on the monitor did ekg shown to Dr Miller patient is resting with call bell in reach 

## 2019-04-15 NOTE — ED Notes (Signed)
Attempted Report 

## 2019-04-16 DIAGNOSIS — J9601 Acute respiratory failure with hypoxia: Secondary | ICD-10-CM | POA: Diagnosis present

## 2019-04-16 DIAGNOSIS — J1289 Other viral pneumonia: Secondary | ICD-10-CM | POA: Diagnosis present

## 2019-04-16 DIAGNOSIS — M109 Gout, unspecified: Secondary | ICD-10-CM | POA: Diagnosis present

## 2019-04-16 DIAGNOSIS — N400 Enlarged prostate without lower urinary tract symptoms: Secondary | ICD-10-CM | POA: Diagnosis present

## 2019-04-16 DIAGNOSIS — I10 Essential (primary) hypertension: Secondary | ICD-10-CM | POA: Diagnosis present

## 2019-04-16 DIAGNOSIS — U071 COVID-19: Secondary | ICD-10-CM | POA: Diagnosis present

## 2019-04-16 DIAGNOSIS — R0902 Hypoxemia: Secondary | ICD-10-CM | POA: Diagnosis present

## 2019-04-16 DIAGNOSIS — E669 Obesity, unspecified: Secondary | ICD-10-CM | POA: Diagnosis present

## 2019-04-16 DIAGNOSIS — Z6834 Body mass index (BMI) 34.0-34.9, adult: Secondary | ICD-10-CM | POA: Diagnosis not present

## 2019-04-16 LAB — MAGNESIUM: Magnesium: 2.4 mg/dL (ref 1.7–2.4)

## 2019-04-16 LAB — COMPREHENSIVE METABOLIC PANEL
ALT: 62 U/L — ABNORMAL HIGH (ref 0–44)
AST: 64 U/L — ABNORMAL HIGH (ref 15–41)
Albumin: 3.3 g/dL — ABNORMAL LOW (ref 3.5–5.0)
Alkaline Phosphatase: 56 U/L (ref 38–126)
Anion gap: 12 (ref 5–15)
BUN: 19 mg/dL (ref 8–23)
CO2: 28 mmol/L (ref 22–32)
Calcium: 9.2 mg/dL (ref 8.9–10.3)
Chloride: 99 mmol/L (ref 98–111)
Creatinine, Ser: 0.94 mg/dL (ref 0.61–1.24)
GFR calc Af Amer: >60 mL/min (ref >60)
GFR calc non Af Amer: >60 mL/min (ref >60)
Glucose, Bld: 167 mg/dL — ABNORMAL HIGH (ref 70–99)
Potassium: 4.3 mmol/L (ref 3.5–5.1)
Sodium: 139 mmol/L (ref 135–145)
Total Bilirubin: 0.6 mg/dL (ref 0.3–1.2)
Total Protein: 7.2 g/dL (ref 6.5–8.1)

## 2019-04-16 LAB — CBC WITH DIFFERENTIAL/PLATELET
Abs Immature Granulocytes: 0.7 10*3/uL — ABNORMAL HIGH (ref 0.00–0.07)
Band Neutrophils: 4 %
Basophils Absolute: 0 10*3/uL (ref 0.0–0.1)
Basophils Relative: 0 %
Eosinophils Absolute: 0 10*3/uL (ref 0.0–0.5)
Eosinophils Relative: 0 %
HCT: 42.1 % (ref 39.0–52.0)
Hemoglobin: 14.5 g/dL (ref 13.0–17.0)
Lymphocytes Relative: 6 %
Lymphs Abs: 0.9 10*3/uL (ref 0.7–4.0)
MCH: 28 pg (ref 26.0–34.0)
MCHC: 34.4 g/dL (ref 30.0–36.0)
MCV: 81.3 fL (ref 80.0–100.0)
Metamyelocytes Relative: 3 %
Monocytes Absolute: 0.4 10*3/uL (ref 0.1–1.0)
Monocytes Relative: 3 %
Myelocytes: 2 %
Neutro Abs: 12.4 10*3/uL — ABNORMAL HIGH (ref 1.7–7.7)
Neutrophils Relative %: 82 %
Platelets: 267 10*3/uL (ref 150–400)
RBC: 5.18 MIL/uL (ref 4.22–5.81)
RDW: 14.8 % (ref 11.5–15.5)
WBC: 14.4 10*3/uL — ABNORMAL HIGH (ref 4.0–10.5)
nRBC: 0.2 % (ref 0.0–0.2)

## 2019-04-16 LAB — ABO/RH: ABO/RH(D): A POS

## 2019-04-16 LAB — HIV ANTIBODY (ROUTINE TESTING W REFLEX): HIV Screen 4th Generation wRfx: NONREACTIVE

## 2019-04-16 MED ORDER — ALBUTEROL SULFATE HFA 108 (90 BASE) MCG/ACT IN AERS
2.0000 | INHALATION_SPRAY | Freq: Four times a day (QID) | RESPIRATORY_TRACT | Status: DC | PRN
  Filled 2019-04-16: qty 6.7

## 2019-04-16 MED ORDER — ENOXAPARIN SODIUM 60 MG/0.6ML ~~LOC~~ SOLN
50.0000 mg | SUBCUTANEOUS | Status: DC
  Administered 2019-04-16 – 2019-04-19 (×4): 50 mg via SUBCUTANEOUS
  Filled 2019-04-16 (×4): qty 0.6

## 2019-04-16 MED ORDER — GUAIFENESIN ER 600 MG PO TB12
600.0000 mg | ORAL_TABLET | Freq: Two times a day (BID) | ORAL | Status: DC
  Administered 2019-04-16 – 2019-04-20 (×9): 600 mg via ORAL
  Filled 2019-04-16 (×9): qty 1

## 2019-04-16 MED ORDER — BENZONATATE 100 MG PO CAPS: 100.0000 mg | ORAL_CAPSULE | Freq: Three times a day (TID) | ORAL | Status: DC | PRN

## 2019-04-16 NOTE — Progress Notes (Signed)
Initial Nutrition Assessment  DOCUMENTATION CODES:   Obesity unspecified  INTERVENTION:   Ensure Enlive po BID, each supplement provides 350 kcal and 20 grams of protein  Pt receiving Hormel Shake daily with Breakfast which provides 520 kcals and 22 g of protein and Magic cup BID with lunch and dinner, each supplement provides 290 kcal and 9 grams of protein, automatically on meal trays to optimize nutritional intake.    NUTRITION DIAGNOSIS:   Increased nutrient needs related to acute illness(COVID) as evidenced by estimated needs.  GOAL:   Patient will meet greater than or equal to 90% of their needs  MONITOR:   PO intake, Supplement acceptance  REASON FOR ASSESSMENT:   Malnutrition Screening Tool    ASSESSMENT:   Pt with PMH of HTN who developed fatigue 6 days ago now admitted with SOB and tested positive for COVID.   Pt on 3L O2.   Medications reviewed and include: solumedrol Labs reviewed: Ferritin: 1151 (H)    NUTRITION - FOCUSED PHYSICAL EXAM:  Deferred   Diet Order:   Diet Order            Diet regular Room service appropriate? Yes; Fluid consistency: Thin  Diet effective now              EDUCATION NEEDS:   No education needs have been identified at this time  Skin:  Skin Assessment: Reviewed RN Assessment  Last BM:  unknown  Height:   Ht Readings from Last 1 Encounters:  04/15/19 5\' 9"  (1.753 m)    Weight:   Wt Readings from Last 1 Encounters:  04/15/19 105.2 kg    Ideal Body Weight:  72.7 kg  BMI:  Body mass index is 34.26 kg/m.  Estimated Nutritional Needs:   Kcal:  2200-2400  Protein:  105-115 grams  Fluid:  > 2L/day  Maylon Peppers RD, [place], [place] Pager [phone] After Hours Pager

## 2019-04-16 NOTE — Progress Notes (Signed)
[facility] Progress Note  Patient: Tracy PouchHerman J Fritz JYN:[id number]   [facility] DOB: May 12, 1956   DOA: 04/15/2019   DOS: 04/16/2019   Date of Service: the patient was seen and examined on 04/16/2019  Brief hospital course: Pt. with PMH of HTN; admitted on 04/15/2019, presented with complaint of cough and shortness of breath, was found to have acute COVID-19 related to viral illness with respiratory failure. Currently further plan is continue current treatment.  Subjective: Still has shortness of breath as well as cough.  Still feels fatigued.  Significant improvement reported as compared to yesterday.  No nausea no vomiting no diarrhea.  Assessment and Plan: 1.  Acute COVID-19 Viral illness Lab Results  Component Value Date   SARSCOV2NAA POSITIVE (A) 04/15/2019   CXR: hazy bilateral peripheral opacities  Recent Labs    04/15/19 1222  DDIMER 1.92*  FERRITIN 1,151*  LDH 440*  CRP 7.9*   Tmax last 24 hours: 99 Oxygen requirements: 3 LPM  Antibiotics: None Diuretics: None Vitamin C and Zinc: Continue DVT Prophylaxis: Subcutaneous Lovenox weight based. Remdesivir: Started on 04/15/2019 Steroids: Solu-Medrol IV started on 04/15/2019 Actemra: Currently no indication for off-label use of Actemra:  Prone positioning: Patient encouraged to stay in prone position as much as possible.  PPE During this encounter: Patient Isolation: Airborne + Droplet + Contact HCP PPE: CAPR, gown. gloves Patient PPE: None  The treatment plan and use of medications and known side effects were discussed with patient/family. It was clearly explained that there is no proven definitive treatment for COVID-19 infection yet. Any medications used here are based on case reports/anecdotal data which are not peer-reviewed and has not been studied using randomized control trials.  Complete risks and long-term side effects are unknown, however in the best clinical judgment they seem to be of  some clinical benefit rather than medical risks.  Patient/family agree with the treatment plan and want to receive these treatments as indicated.  2.  Essential hypertension. Blood pressure stable. Currently holding.  3.  BPH. Continue Flomax.  4. History of gout. Currently no evidence of flare. Monitor.  5.  Obesity Body mass index is 34.26 kg/m.  Nutrition Problem: Increased nutrient needs Etiology: acute illness(COVID) Interventions: Interventions: Refer to RD note for recommendations  Diet: Cardiac diet DVT Prophylaxis: Subcutaneous Lovenox  Advance goals of care discussion: Full code  Family Communication: no family was present at bedside, at the time of interview. The pt provided permission to discuss medical plan with the family. Opportunity was given to ask question and all questions were answered satisfactorily.   Disposition:  Discharge to Home .  Consultants: none Procedures: none  Scheduled Meds: . enoxaparin (LOVENOX) injection  50 mg Subcutaneous Q24H  . feeding supplement (ENSURE ENLIVE)  237 mL Oral BID BM  . guaiFENesin  600 mg Oral BID  . methylPREDNISolone (SOLU-MEDROL) injection  60 mg Intravenous Q6H   Continuous Infusions: . remdesivir 100 mg in NS 250 mL     PRN Meds: acetaminophen, albuterol, benzonatate Antibiotics: Anti-infectives (From admission, onward)   Start     Dose/Rate Route Frequency Ordered Stop   04/16/19 2000  remdesivir 100 mg in sodium chloride 0.9 % 250 mL IVPB     100 mg 500 mL/hr over 30 Minutes Intravenous Every 24 hours 04/15/19 1826 04/20/19 1959   04/15/19 2000  remdesivir 200 mg in sodium chloride 0.9 % 250 mL IVPB     200 mg 500 mL/hr over 30 Minutes Intravenous Once 04/15/19  1826 04/15/19 2044   04/15/19 1215  cefTRIAXone (ROCEPHIN) 1 g in sodium chloride 0.9 % 100 mL IVPB     1 g 200 mL/hr over 30 Minutes Intravenous  Once 04/15/19 1204 04/15/19 1330   04/15/19 1215  azithromycin (ZITHROMAX) 500 mg in sodium  chloride 0.9 % 250 mL IVPB     500 mg 250 mL/hr over 60 Minutes Intravenous  Once 04/15/19 1204 04/15/19 1356       Objective: Physical Exam: Vitals:   04/15/19 2115 04/16/19 0000 04/16/19 0409 04/16/19 0902  BP: 131/84  125/89 117/78  Pulse: 84 77 92 78  Resp: (!) 25 (!) 22 (!) 24 (!) 22  Temp: 98 F (36.7 C)  98 F (36.7 C) (!) 97.5 F (36.4 C)  TempSrc: Oral  Oral Oral  SpO2: 93% 93% 93% 95%  Weight:      Height:        Intake/Output Summary (Last 24 hours) at 04/16/2019 1808 Last data filed at 04/15/2019 2300 Gross per 24 hour  Intake 521.53 ml  Output -  Net 521.53 ml   Filed Weights   04/15/19 1053  Weight: 105.2 kg   General: alert and oriented to time, place, and person. Appear in moderate distress, affect appropriate Eyes: PERRL, Conjunctiva normal ENT: Oral Mucosa Clear, moist  Neck: no JVD, no Abnormal Mass Or lumps Cardiovascular: S1 and S2 Present, no Murmur, peripheral pulses symmetrical Respiratory: normal respiratory effort, Bilateral Air entry equal and Decreased, no use of accessory muscle, bilateral  Crackles, no wheezes Abdomen: Bowel Sound present, Soft and no tenderness, no hernia Skin: no rashes  Extremities: bilateral  Pedal edema, no calf tenderness Neurologic: normal without focal findings, mental status, speech normal, alert and oriented x3, PERLA, Motor strength 5/5 and symmetric and sensation grossly normal to light touch Gait not checked due to patient safety concerns  Data Reviewed: CBC: Recent Labs  Lab 04/15/19 1114 04/16/19 0200  WBC 15.2* 14.4*  NEUTROABS 9.0* 12.4*  HGB 16.0 14.5  HCT 45.4 42.1  MCV 80.5 81.3  PLT 237 448   Basic Metabolic Panel: Recent Labs  Lab 04/15/19 1114 04/16/19 0200  NA 139 139  K 4.2 4.3  CL 99 99  CO2 26 28  GLUCOSE 110* 167*  BUN 15 19  CREATININE 1.36* 0.94  CALCIUM 9.4 9.2  MG  --  2.4    Liver Function Tests: Recent Labs  Lab 04/15/19 1310 04/16/19 0200  AST 52* 64*  ALT  53* 62*  ALKPHOS 56 56  BILITOT 0.8 0.6  PROT 6.7 7.2  ALBUMIN 3.0* 3.3*   No results for input(s): LIPASE, AMYLASE in the last 168 hours. No results for input(s): AMMONIA in the last 168 hours. Coagulation Profile: No results for input(s): INR, PROTIME in the last 168 hours. Cardiac Enzymes: Recent Labs  Lab 04/15/19 1222  CKTOTAL 76   BNP (last 3 results) No results for input(s): PROBNP in the last 8760 hours. CBG: No results for input(s): GLUCAP in the last 168 hours. Studies: No results found.   Time spent: 35 minutes  Author: Berle Mull, MD [facility] 04/16/2019 6:08 PM  To reach On-call, see care teams to locate the attending and reach out to them via [email or web address]. If 7PM-7AM, please contact night-coverage If you still have difficulty reaching the attending provider, please page the [facility] (Director on Call) for [facility] on amion for assistance.

## 2019-04-16 NOTE — Progress Notes (Signed)
Ok to increase lovenox to 0.5mg /kg/day per Dr. Posey Pronto.  Lovenox 50mg  SQ qday  Onnie Boer, PharmD, [place], AAHIVP, CPP Infectious Disease Pharmacist 04/16/2019 9:51 AM

## 2019-04-17 LAB — COMPREHENSIVE METABOLIC PANEL
ALT: 77 U/L — ABNORMAL HIGH (ref 0–44)
AST: 49 U/L — ABNORMAL HIGH (ref 15–41)
Albumin: 3.1 g/dL — ABNORMAL LOW (ref 3.5–5.0)
Alkaline Phosphatase: 57 U/L (ref 38–126)
Anion gap: 9 (ref 5–15)
BUN: 26 mg/dL — ABNORMAL HIGH (ref 8–23)
CO2: 27 mmol/L (ref 22–32)
Calcium: 9.4 mg/dL (ref 8.9–10.3)
Chloride: 104 mmol/L (ref 98–111)
Creatinine, Ser: 1.04 mg/dL (ref 0.61–1.24)
GFR calc Af Amer: >60 mL/min (ref >60)
GFR calc non Af Amer: >60 mL/min (ref >60)
Glucose, Bld: 142 mg/dL — ABNORMAL HIGH (ref 70–99)
Potassium: 4.3 mmol/L (ref 3.5–5.1)
Sodium: 140 mmol/L (ref 135–145)
Total Bilirubin: 0.3 mg/dL (ref 0.3–1.2)
Total Protein: 6.9 g/dL (ref 6.5–8.1)

## 2019-04-17 LAB — CBC WITH DIFFERENTIAL/PLATELET
Abs Immature Granulocytes: 1.04 10*3/uL — ABNORMAL HIGH (ref 0.00–0.07)
Basophils Absolute: 0.1 10*3/uL (ref 0.0–0.1)
Basophils Relative: 0 %
Eosinophils Absolute: 0 10*3/uL (ref 0.0–0.5)
Eosinophils Relative: 0 %
HCT: 41.4 % (ref 39.0–52.0)
Hemoglobin: 14.7 g/dL (ref 13.0–17.0)
Immature Granulocytes: 4 %
Lymphocytes Relative: 6 %
Lymphs Abs: 1.4 10*3/uL (ref 0.7–4.0)
MCH: 28.7 pg (ref 26.0–34.0)
MCHC: 35.5 g/dL (ref 30.0–36.0)
MCV: 80.9 fL (ref 80.0–100.0)
Monocytes Absolute: 1.5 10*3/uL — ABNORMAL HIGH (ref 0.1–1.0)
Monocytes Relative: 6 %
Neutro Abs: 22 10*3/uL — ABNORMAL HIGH (ref 1.7–7.7)
Neutrophils Relative %: 84 %
Platelets: 308 10*3/uL (ref 150–400)
RBC: 5.12 MIL/uL (ref 4.22–5.81)
RDW: 14.7 % (ref 11.5–15.5)
WBC: 26 10*3/uL — ABNORMAL HIGH (ref 4.0–10.5)
nRBC: 0.1 % (ref 0.0–0.2)

## 2019-04-17 LAB — MAGNESIUM: Magnesium: 2.4 mg/dL (ref 1.7–2.4)

## 2019-04-17 MED ORDER — DEXAMETHASONE SODIUM PHOSPHATE 10 MG/ML IJ SOLN
6.0000 mg | Freq: Every day | INTRAMUSCULAR | Status: DC
  Administered 2019-04-17: 6 mg via INTRAVENOUS
  Filled 2019-04-17: qty 1

## 2019-04-17 NOTE — Progress Notes (Signed)
[facility] Progress Note  Patient: Tracy PouchHerman J Nazzaro ZHY:[id number]   PCP: [facility] DOB: 03/14/1956   DOA: 04/15/2019   DOS: 04/17/2019   Date of Service: the patient was seen and examined on 04/17/2019  Brief hospital course: Pt. with PMH of HTN; admitted on 04/15/2019, presented with complaint of cough and shortness of breath, was found to have acute COVID-19 related to viral illness with respiratory failure. Currently further plan is continue current treatment.  Subjective: Some improvement in breathing.  No nausea no vomiting.  Still has cough.  No diarrhea reported.  Oral intake improving.  Assessment and Plan: 1. Acute COVID-19 Viral illness Acute hypoxic respiratory failure. Lab Results  Component Value Date   SARSCOV2NAA POSITIVE (A) 04/15/2019   CXR: hazy bilateral peripheral opacities  Recent Labs    04/15/19 1222  DDIMER 1.92*  FERRITIN 1,151*  LDH 440*  CRP 7.9*   Tmax last 24 hours: 99 Oxygen requirements: 3 LPM  Antibiotics: None Diuretics: None Vitamin C and Zinc: Continue DVT Prophylaxis: Subcutaneous Lovenox weight based. Remdesivir: Started on 04/15/2019 Steroids: Solu-Medrol IV started on 04/15/2019 Actemra: Currently no indication for off-label use of Actemra:  Prone positioning: Patient encouraged to stay in prone position as much as possible.  PPE During this encounter: Patient Isolation: Airborne + Droplet + Contact HCP PPE: CAPR, gown. gloves Patient PPE: None  The treatment plan and use of medications and known side effects were discussed with patient/family. It was clearly explained that there is no proven definitive treatment for COVID-19 infection yet. Any medications used here are based on case reports/anecdotal data which are not peer-reviewed and has not been studied using randomized control trials.  Complete risks and long-term side effects are unknown, however in the best clinical judgment they seem to be of some  clinical benefit rather than medical risks.  Patient/family agree with the treatment plan and want to receive these treatments as indicated.  2.  Essential hypertension. Blood pressure stable. Currently holding.  3.  BPH. Continue Flomax.  4. History of gout. Currently no evidence of flare. Monitor.  5.  Obesity Body mass index is 34.26 kg/m.  Nutrition Problem: Increased nutrient needs Etiology: acute illness(COVID) Interventions: Interventions: Refer to RD note for recommendations  Diet: Cardiac diet DVT Prophylaxis: Subcutaneous Lovenox  Advance goals of care discussion: Full code  Family Communication: no family was present at bedside, at the time of interview. The pt provided permission to discuss medical plan with the family. Opportunity was given to ask question and all questions were answered satisfactorily.   Disposition:  Discharge to Home .  Consultants: none Procedures: none  Scheduled Meds: . dexamethasone (DECADRON) injection  6 mg Intravenous Daily  . enoxaparin (LOVENOX) injection  50 mg Subcutaneous Q24H  . feeding supplement (ENSURE ENLIVE)  237 mL Oral BID BM  . guaiFENesin  600 mg Oral BID   Continuous Infusions: . remdesivir 100 mg in NS 250 mL 100 mg (04/16/19 2110)   PRN Meds: acetaminophen, albuterol, benzonatate Antibiotics: Anti-infectives (From admission, onward)   Start     Dose/Rate Route Frequency Ordered Stop   04/16/19 2000  remdesivir 100 mg in sodium chloride 0.9 % 250 mL IVPB     100 mg 500 mL/hr over 30 Minutes Intravenous Every 24 hours 04/15/19 1826 04/20/19 1959   04/15/19 2000  remdesivir 200 mg in sodium chloride 0.9 % 250 mL IVPB     200 mg 500 mL/hr over 30 Minutes Intravenous Once 04/15/19 1826 04/15/19  2044   04/15/19 1215  cefTRIAXone (ROCEPHIN) 1 g in sodium chloride 0.9 % 100 mL IVPB     1 g 200 mL/hr over 30 Minutes Intravenous  Once 04/15/19 1204 04/15/19 1330   04/15/19 1215  azithromycin (ZITHROMAX) 500 mg in  sodium chloride 0.9 % 250 mL IVPB     500 mg 250 mL/hr over 60 Minutes Intravenous  Once 04/15/19 1204 04/15/19 1356       Objective: Physical Exam: Vitals:   04/17/19 0822 04/17/19 1200 04/17/19 1400 04/17/19 1658  BP: 124/79   118/62  Pulse: (!) 56 65 76 (!) 54  Resp: 17 20 17  (!) 22  Temp: 98.1 F (36.7 C)   97.6 F (36.4 C)  TempSrc: Oral   Oral  SpO2: 96% 95% 95% 93%  Weight:      Height:        Intake/Output Summary (Last 24 hours) at 04/17/2019 1808 Last data filed at 04/17/2019 1300 Gross per 24 hour  Intake 720 ml  Output 1100 ml  Net -380 ml   Filed Weights   04/15/19 1053  Weight: 105.2 kg   General: alert and oriented to time, place, and person. Appear in moderate distress, affect appropriate Eyes: PERRL, Conjunctiva normal ENT: Oral Mucosa Clear, moist  Neck: no JVD, no Abnormal Mass Or lumps Cardiovascular: S1 and S2 Present, no Murmur, peripheral pulses symmetrical Respiratory: normal respiratory effort, Bilateral Air entry equal and Decreased, no use of accessory muscle, bilateral  Crackles, no wheezes Abdomen: Bowel Sound present, Soft and no tenderness, no hernia Skin: no rashes  Extremities: bilateral  Pedal edema, no calf tenderness Neurologic: normal without focal findings, mental status, speech normal, alert and oriented x3, PERLA, Motor strength 5/5 and symmetric and sensation grossly normal to light touch Gait not checked due to patient safety concerns  Data Reviewed: CBC: Recent Labs  Lab 04/15/19 1114 04/16/19 0200 04/17/19 0420  WBC 15.2* 14.4* 26.0*  NEUTROABS 9.0* 12.4* 22.0*  HGB 16.0 14.5 14.7  HCT 45.4 42.1 41.4  MCV 80.5 81.3 80.9  PLT 237 267 341   Basic Metabolic Panel: Recent Labs  Lab 04/15/19 1114 04/16/19 0200 04/17/19 0420  NA 139 139 140  K 4.2 4.3 4.3  CL 99 99 104  CO2 26 28 27   GLUCOSE 110* 167* 142*  BUN 15 19 26*  CREATININE 1.36* 0.94 1.04  CALCIUM 9.4 9.2 9.4  MG  --  2.4 2.4    Liver Function  Tests: Recent Labs  Lab 04/15/19 1310 04/16/19 0200 04/17/19 0420  AST 52* 64* 49*  ALT 53* 62* 77*  ALKPHOS 56 56 57  BILITOT 0.8 0.6 0.3  PROT 6.7 7.2 6.9  ALBUMIN 3.0* 3.3* 3.1*   No results for input(s): LIPASE, AMYLASE in the last 168 hours. No results for input(s): AMMONIA in the last 168 hours. Coagulation Profile: No results for input(s): INR, PROTIME in the last 168 hours. Cardiac Enzymes: Recent Labs  Lab 04/15/19 1222  CKTOTAL 76   BNP (last 3 results) No results for input(s): PROBNP in the last 8760 hours. CBG: No results for input(s): GLUCAP in the last 168 hours. Studies: No results found.   Time spent: 35 minutes  Author: Berle Mull, MD [facility] 04/17/2019 6:08 PM  To reach On-call, see care teams to locate the attending and reach out to them via [email or web address]. If 7PM-7AM, please contact night-coverage If you still have difficulty reaching the attending provider, please page the [facility] (Director on  Call) for [facility] on amion for assistance.

## 2019-04-17 NOTE — Progress Notes (Signed)
The pt's primary contact, Marena Chancy, was called and provided with a daily update. Any questions or concerns were addressed.

## 2019-04-18 ENCOUNTER — Encounter (HOSPITAL_COMMUNITY): Payer: Self-pay

## 2019-04-18 LAB — CBC WITH DIFFERENTIAL/PLATELET
Abs Immature Granulocytes: 0.35 10*3/uL — ABNORMAL HIGH (ref 0.00–0.07)
Basophils Absolute: 0.1 10*3/uL (ref 0.0–0.1)
Basophils Relative: 0 %
Eosinophils Absolute: 0 10*3/uL (ref 0.0–0.5)
Eosinophils Relative: 0 %
HCT: 38.2 % — ABNORMAL LOW (ref 39.0–52.0)
Hemoglobin: 13.3 g/dL (ref 13.0–17.0)
Immature Granulocytes: 1 %
Lymphocytes Relative: 6 %
Lymphs Abs: 1.5 10*3/uL (ref 0.7–4.0)
MCH: 28.1 pg (ref 26.0–34.0)
MCHC: 34.8 g/dL (ref 30.0–36.0)
MCV: 80.8 fL (ref 80.0–100.0)
Monocytes Absolute: 2.3 10*3/uL — ABNORMAL HIGH (ref 0.1–1.0)
Monocytes Relative: 9 %
Neutro Abs: 20.8 10*3/uL — ABNORMAL HIGH (ref 1.7–7.7)
Neutrophils Relative %: 84 %
Platelets: 299 10*3/uL (ref 150–400)
RBC: 4.73 MIL/uL (ref 4.22–5.81)
RDW: 14.8 % (ref 11.5–15.5)
WBC: 25 10*3/uL — ABNORMAL HIGH (ref 4.0–10.5)
nRBC: 0 % (ref 0.0–0.2)

## 2019-04-18 LAB — COMPREHENSIVE METABOLIC PANEL
ALT: 61 U/L — ABNORMAL HIGH (ref 0–44)
AST: 29 U/L (ref 15–41)
Albumin: 2.9 g/dL — ABNORMAL LOW (ref 3.5–5.0)
Alkaline Phosphatase: 53 U/L (ref 38–126)
Anion gap: 7 (ref 5–15)
BUN: 26 mg/dL — ABNORMAL HIGH (ref 8–23)
CO2: 29 mmol/L (ref 22–32)
Calcium: 8.8 mg/dL — ABNORMAL LOW (ref 8.9–10.3)
Chloride: 105 mmol/L (ref 98–111)
Creatinine, Ser: 0.97 mg/dL (ref 0.61–1.24)
GFR calc Af Amer: >60 mL/min (ref >60)
GFR calc non Af Amer: >60 mL/min (ref >60)
Glucose, Bld: 136 mg/dL — ABNORMAL HIGH (ref 70–99)
Potassium: 3.8 mmol/L (ref 3.5–5.1)
Sodium: 141 mmol/L (ref 135–145)
Total Bilirubin: 0.3 mg/dL (ref 0.3–1.2)
Total Protein: 6 g/dL — ABNORMAL LOW (ref 6.5–8.1)

## 2019-04-18 LAB — D-DIMER, QUANTITATIVE: D-Dimer, Quant: 0.71 ug/mL-FEU — ABNORMAL HIGH (ref 0.00–0.50)

## 2019-04-18 LAB — FERRITIN: Ferritin: 790 ng/mL — ABNORMAL HIGH (ref 24–336)

## 2019-04-18 LAB — C-REACTIVE PROTEIN: CRP: 1.2 mg/dL — ABNORMAL HIGH (ref <1.0)

## 2019-04-18 LAB — MAGNESIUM: Magnesium: 2.3 mg/dL (ref 1.7–2.4)

## 2019-04-18 MED ORDER — FAMOTIDINE 20 MG PO TABS
20.0000 mg | ORAL_TABLET | Freq: Every day | ORAL | Status: DC
  Administered 2019-04-18 – 2019-04-20 (×3): 20 mg via ORAL
  Filled 2019-04-18 (×3): qty 1

## 2019-04-18 MED ORDER — ALUM & MAG HYDROXIDE-SIMETH 200-200-20 MG/5ML PO SUSP: 15.0000 mL | ORAL | Status: DC | PRN

## 2019-04-18 MED ORDER — DEXAMETHASONE 6 MG PO TABS
6.0000 mg | ORAL_TABLET | Freq: Every day | ORAL | Status: DC
  Administered 2019-04-18 – 2019-04-20 (×3): 6 mg via ORAL
  Filled 2019-04-18 (×3): qty 1

## 2019-04-18 NOTE — Progress Notes (Signed)
[facility] Progress Note  Patient: Tracy PouchHerman J Karle FAO:[id number]   PCP: [facility] DOB: 09/20/1956   DOA: 04/15/2019   DOS: 04/18/2019   Date of Service: the patient was seen and examined on 04/18/2019  Brief hospital course: Pt. with PMH of HTN; admitted on 04/15/2019, presented with complaint of cough and shortness of breath, was found to have acute COVID-19 related to viral illness with respiratory failure. Currently further plan is continue current treatment.  Subjective: Denies any acute complaint.  Cough still present.  Breathing improving.  No nausea no vomiting.  Assessment and Plan: 1. Acute COVID-19 Viral illness Acute hypoxic respiratory failure. Lab Results  Component Value Date   SARSCOV2NAA POSITIVE (A) 04/15/2019   CXR: hazy bilateral peripheral opacities  Recent Labs    04/18/19 0441  DDIMER 0.71*  FERRITIN 790*  CRP 1.2*   Tmax last 24 hours: 98.3 Oxygen requirements: On room air  Antibiotics: None Diuretics: None Vitamin C and Zinc: Continue DVT Prophylaxis: Subcutaneous Lovenox weight based. Remdesivir: Started on 04/15/2019 Steroids: Solu-Medrol IV started on 04/15/2019, switched to Decadron Actemra: Currently no indication for off-label use of Actemra:  Prone positioning: Patient encouraged to stay in prone position as much as possible.  PPE During this encounter: Patient Isolation: Airborne + Droplet + Contact HCP PPE: CAPR, gown. gloves Patient PPE: None  The treatment plan and use of medications and known side effects were discussed with patient/family. It was clearly explained that there is no proven definitive treatment for COVID-19 infection yet. Any medications used here are based on case reports/anecdotal data which are not peer-reviewed and has not been studied using randomized control trials.  Complete risks and long-term side effects are unknown, however in the best clinical judgment they seem to be of some clinical  benefit rather than medical risks.  Patient/family agree with the treatment plan and want to receive these treatments as indicated.  2.  Essential hypertension. Blood pressure stable. Currently holding.  3.  BPH. Continue Flomax.  4. History of gout. Currently no evidence of flare. Monitor.  5.  Obesity Body mass index is 34.26 kg/m.  Nutrition Problem: Increased nutrient needs Etiology: acute illness(COVID) Interventions: Interventions: Refer to RD note for recommendations  Diet: Cardiac diet DVT Prophylaxis: Subcutaneous Lovenox  Advance goals of care discussion: Full code  Family Communication: no family was present at bedside, at the time of interview. The pt provided permission to discuss medical plan with the family. Opportunity was given to ask question and all questions were answered satisfactorily.   Disposition:  Discharge to Home .  Consultants: none Procedures: none  Scheduled Meds: . dexamethasone  6 mg Oral Q breakfast  . enoxaparin (LOVENOX) injection  50 mg Subcutaneous Q24H  . famotidine  20 mg Oral Daily  . feeding supplement (ENSURE ENLIVE)  237 mL Oral BID BM  . guaiFENesin  600 mg Oral BID   Continuous Infusions: . remdesivir 100 mg in NS 250 mL 100 mg (04/17/19 2059)   PRN Meds: acetaminophen, albuterol, alum & mag hydroxide-simeth, benzonatate Antibiotics: Anti-infectives (From admission, onward)   Start     Dose/Rate Route Frequency Ordered Stop   04/16/19 2000  remdesivir 100 mg in sodium chloride 0.9 % 250 mL IVPB     100 mg 500 mL/hr over 30 Minutes Intravenous Every 24 hours 04/15/19 1826 04/20/19 1959   04/15/19 2000  remdesivir 200 mg in sodium chloride 0.9 % 250 mL IVPB     200 mg 500 mL/hr over  30 Minutes Intravenous Once 04/15/19 1826 04/15/19 2044   04/15/19 1215  cefTRIAXone (ROCEPHIN) 1 g in sodium chloride 0.9 % 100 mL IVPB     1 g 200 mL/hr over 30 Minutes Intravenous  Once 04/15/19 1204 04/15/19 1330   04/15/19 1215   azithromycin (ZITHROMAX) 500 mg in sodium chloride 0.9 % 250 mL IVPB     500 mg 250 mL/hr over 60 Minutes Intravenous  Once 04/15/19 1204 04/15/19 1356       Objective: Physical Exam: Vitals:   04/17/19 2000 04/17/19 2001 04/18/19 0430 04/18/19 0825  BP: 115/72 115/72 119/73 116/74  Pulse: 69   (!) 40  Resp: (!) 31   (!) 24  Temp:  98.3 F (36.8 C) 97.9 F (36.6 C) 97.8 F (36.6 C)  TempSrc:  Oral Oral Oral  SpO2: 97%   97%  Weight:      Height:        Intake/Output Summary (Last 24 hours) at 04/18/2019 1744 Last data filed at 04/18/2019 1400 Gross per 24 hour  Intake 250 ml  Output -  Net 250 ml   Filed Weights   04/15/19 1053  Weight: 105.2 kg   General: alert and oriented to time, place, and person. Appear in moderate distress, affect appropriate Eyes: PERRL, Conjunctiva normal ENT: Oral Mucosa Clear, moist  Neck: no JVD, no Abnormal Mass Or lumps Cardiovascular: S1 and S2 Present, no Murmur, peripheral pulses symmetrical Respiratory: normal respiratory effort, Bilateral Air entry equal and Decreased, no use of accessory muscle, bilateral  Crackles, no wheezes Abdomen: Bowel Sound present, Soft and no tenderness, no hernia Skin: no rashes  Extremities: bilateral  Pedal edema, no calf tenderness Neurologic: normal without focal findings, mental status, speech normal, alert and oriented x3, PERLA, Motor strength 5/5 and symmetric and sensation grossly normal to light touch Gait not checked due to patient safety concerns  Data Reviewed: CBC: Recent Labs  Lab 04/15/19 1114 04/16/19 0200 04/17/19 0420 04/18/19 0441  WBC 15.2* 14.4* 26.0* 25.0*  NEUTROABS 9.0* 12.4* 22.0* 20.8*  HGB 16.0 14.5 14.7 13.3  HCT 45.4 42.1 41.4 38.2*  MCV 80.5 81.3 80.9 80.8  PLT 237 267 308 914   Basic Metabolic Panel: Recent Labs  Lab 04/15/19 1114 04/16/19 0200 04/17/19 0420 04/18/19 0441  NA 139 139 140 141  K 4.2 4.3 4.3 3.8  CL 99 99 104 105  CO2 26 28 27 29   GLUCOSE  110* 167* 142* 136*  BUN 15 19 26* 26*  CREATININE 1.36* 0.94 1.04 0.97  CALCIUM 9.4 9.2 9.4 8.8*  MG  --  2.4 2.4 2.3    Liver Function Tests: Recent Labs  Lab 04/15/19 1310 04/16/19 0200 04/17/19 0420 04/18/19 0441  AST 52* 64* 49* 29  ALT 53* 62* 77* 61*  ALKPHOS 56 56 57 53  BILITOT 0.8 0.6 0.3 0.3  PROT 6.7 7.2 6.9 6.0*  ALBUMIN 3.0* 3.3* 3.1* 2.9*   No results for input(s): LIPASE, AMYLASE in the last 168 hours. No results for input(s): AMMONIA in the last 168 hours. Coagulation Profile: No results for input(s): INR, PROTIME in the last 168 hours. Cardiac Enzymes: Recent Labs  Lab 04/15/19 1222  CKTOTAL 76   BNP (last 3 results) No results for input(s): PROBNP in the last 8760 hours. CBG: No results for input(s): GLUCAP in the last 168 hours. Studies: No results found.   Time spent: 35 minutes  Author: Berle Mull, MD [facility] 04/18/2019 5:44 PM  To  reach On-call, see care teams to locate the attending and reach out to them via [email or web address]. If 7PM-7AM, please contact night-coverage If you still have difficulty reaching the attending provider, please page the [facility] (Director on Call) for [facility] on amion for assistance.

## 2019-04-18 NOTE — Progress Notes (Signed)
Spoke to patients primary contact to make her aware of daily updates.

## 2019-04-19 LAB — COMPREHENSIVE METABOLIC PANEL
ALT: 53 U/L — ABNORMAL HIGH (ref 0–44)
AST: 23 U/L (ref 15–41)
Albumin: 3 g/dL — ABNORMAL LOW (ref 3.5–5.0)
Alkaline Phosphatase: 52 U/L (ref 38–126)
Anion gap: 9 (ref 5–15)
BUN: 22 mg/dL (ref 8–23)
CO2: 26 mmol/L (ref 22–32)
Calcium: 8.7 mg/dL — ABNORMAL LOW (ref 8.9–10.3)
Chloride: 106 mmol/L (ref 98–111)
Creatinine, Ser: 1.07 mg/dL (ref 0.61–1.24)
GFR calc Af Amer: >60 mL/min (ref >60)
GFR calc non Af Amer: >60 mL/min (ref >60)
Glucose, Bld: 115 mg/dL — ABNORMAL HIGH (ref 70–99)
Potassium: 4.1 mmol/L (ref 3.5–5.1)
Sodium: 141 mmol/L (ref 135–145)
Total Bilirubin: 0.4 mg/dL (ref 0.3–1.2)
Total Protein: 5.9 g/dL — ABNORMAL LOW (ref 6.5–8.1)

## 2019-04-19 LAB — CBC WITH DIFFERENTIAL/PLATELET
Abs Immature Granulocytes: 0.2 10*3/uL — ABNORMAL HIGH (ref 0.00–0.07)
Basophils Absolute: 0 10*3/uL (ref 0.0–0.1)
Basophils Relative: 0 %
Eosinophils Absolute: 0 10*3/uL (ref 0.0–0.5)
Eosinophils Relative: 0 %
HCT: 39.5 % (ref 39.0–52.0)
Hemoglobin: 13.7 g/dL (ref 13.0–17.0)
Immature Granulocytes: 1 %
Lymphocytes Relative: 10 %
Lymphs Abs: 2 10*3/uL (ref 0.7–4.0)
MCH: 28 pg (ref 26.0–34.0)
MCHC: 34.7 g/dL (ref 30.0–36.0)
MCV: 80.8 fL (ref 80.0–100.0)
Monocytes Absolute: 2.1 10*3/uL — ABNORMAL HIGH (ref 0.1–1.0)
Monocytes Relative: 10 %
Neutro Abs: 15.9 10*3/uL — ABNORMAL HIGH (ref 1.7–7.7)
Neutrophils Relative %: 79 %
Platelets: 299 10*3/uL (ref 150–400)
RBC: 4.89 MIL/uL (ref 4.22–5.81)
RDW: 14.7 % (ref 11.5–15.5)
WBC: 20.2 10*3/uL — ABNORMAL HIGH (ref 4.0–10.5)
nRBC: 0 % (ref 0.0–0.2)

## 2019-04-19 LAB — PATHOLOGIST SMEAR REVIEW

## 2019-04-19 LAB — C-REACTIVE PROTEIN: CRP: 0.9 mg/dL (ref <1.0)

## 2019-04-19 LAB — FERRITIN: Ferritin: 627 ng/mL — ABNORMAL HIGH (ref 24–336)

## 2019-04-19 LAB — D-DIMER, QUANTITATIVE: D-Dimer, Quant: 0.82 ug/mL-FEU — ABNORMAL HIGH (ref 0.00–0.50)

## 2019-04-19 LAB — MAGNESIUM: Magnesium: 2.3 mg/dL (ref 1.7–2.4)

## 2019-04-19 NOTE — Progress Notes (Signed)
[facility] Progress Note  Patient: Tracy Fritz RUE:[id number]   PCP: [facility] DOB: 1955/11/25   DOA: 04/15/2019   DOS: 04/19/2019   Date of Service: the patient was seen and examined on 04/19/2019  Brief hospital course: Pt. with PMH of HTN; admitted on 04/15/2019, presented with complaint of cough and shortness of breath, was found to have acute COVID-19 related to viral illness with respiratory failure. Currently further plan is continue current treatment.  Subjective: Somewhat tired.  No nausea no vomiting.  No chest pain.  Assessment and Plan: 1. Acute COVID-19 Viral illness Acute hypoxic respiratory failure. Lab Results  Component Value Date   SARSCOV2NAA POSITIVE (A) 04/15/2019   CXR: hazy bilateral peripheral opacities  Recent Labs    04/18/19 0441 04/19/19 0435  DDIMER 0.71* 0.82*  FERRITIN 790* 627*  CRP 1.2* 0.9   Tmax last 24 hours: 98.0 Oxygen requirements: On room air with no occasional desaturation to 84%  Antibiotics: None Diuretics: None Vitamin C and Zinc: Continue DVT Prophylaxis: Subcutaneous Lovenox weight based. Remdesivir: Started on 04/15/2019 Steroids: Solu-Medrol IV started on 04/15/2019, switched to Decadron Actemra: Currently no indication for off-label use of Actemra:  Prone positioning: Patient encouraged to stay in prone position as much as possible.  PPE During this encounter: Patient Isolation: Airborne + Droplet + Contact HCP PPE: CAPR, gown. gloves Patient PPE: None  The treatment plan and use of medications and known side effects were discussed with patient/family. It was clearly explained that there is no proven definitive treatment for COVID-19 infection yet. Any medications used here are based on case reports/anecdotal data which are not peer-reviewed and has not been studied using randomized control trials.  Complete risks and long-term side effects are unknown, however in the best clinical judgment they seem  to be of some clinical benefit rather than medical risks.  Patient/family agree with the treatment plan and want to receive these treatments as indicated.  Will require home oxygen evaluation prior to discharge.  2.  Essential hypertension. Blood pressure stable. Currently holding.  3.  BPH. Continue Flomax.  4. History of gout. Currently no evidence of flare. Monitor.  5.  Obesity Body mass index is 34.26 kg/m.  Nutrition Problem: Increased nutrient needs Etiology: acute illness(COVID) Interventions: Interventions: Refer to RD note for recommendations  Diet: Cardiac diet DVT Prophylaxis: Subcutaneous Lovenox  Advance goals of care discussion: Full code  Family Communication: no family was present at bedside, at the time of interview.  Disposition:  Discharge to Home tomorrow after home oxygen evaluation  Consultants: none Procedures: none  Scheduled Meds: . dexamethasone  6 mg Oral Q breakfast  . enoxaparin (LOVENOX) injection  50 mg Subcutaneous Q24H  . famotidine  20 mg Oral Daily  . feeding supplement (ENSURE ENLIVE)  237 mL Oral BID BM  . guaiFENesin  600 mg Oral BID   Continuous Infusions: . remdesivir 100 mg in NS 250 mL 100 mg (04/18/19 2059)   PRN Meds: acetaminophen, albuterol, alum & mag hydroxide-simeth, benzonatate Antibiotics: Anti-infectives (From admission, onward)   Start     Dose/Rate Route Frequency Ordered Stop   04/16/19 2000  remdesivir 100 mg in sodium chloride 0.9 % 250 mL IVPB     100 mg 500 mL/hr over 30 Minutes Intravenous Every 24 hours 04/15/19 1826 04/20/19 1959   04/15/19 2000  remdesivir 200 mg in sodium chloride 0.9 % 250 mL IVPB     200 mg 500 mL/hr over 30 Minutes Intravenous Once 04/15/19  1826 04/15/19 2044   04/15/19 1215  cefTRIAXone (ROCEPHIN) 1 g in sodium chloride 0.9 % 100 mL IVPB     1 g 200 mL/hr over 30 Minutes Intravenous  Once 04/15/19 1204 04/15/19 1330   04/15/19 1215  azithromycin (ZITHROMAX) 500 mg in sodium  chloride 0.9 % 250 mL IVPB     500 mg 250 mL/hr over 60 Minutes Intravenous  Once 04/15/19 1204 04/15/19 1356       Objective: Physical Exam: Vitals:   04/18/19 2012 04/19/19 0359 04/19/19 0800 04/19/19 1625  BP:  130/81 135/90 110/71  Pulse:    67  Resp:    17  Temp: 98 F (36.7 C) 97.7 F (36.5 C) 97.7 F (36.5 C) 98 F (36.7 C)  TempSrc: Oral Oral    SpO2:    95%  Weight:      Height:        Intake/Output Summary (Last 24 hours) at 04/19/2019 1754 Last data filed at 04/18/2019 1800 Gross per 24 hour  Intake 0 ml  Output -  Net 0 ml   Filed Weights   04/15/19 1053  Weight: 105.2 kg   General: alert and oriented to time, place, and person. Appear in moderate distress, affect appropriate Eyes: PERRL, Conjunctiva normal ENT: Oral Mucosa Clear, moist  Neck: no JVD, no Abnormal Mass Or lumps Cardiovascular: S1 and S2 Present, no Murmur, peripheral pulses symmetrical Respiratory: normal respiratory effort, Bilateral Air entry equal and Decreased, no use of accessory muscle, bilateral  Crackles, no wheezes Abdomen: Bowel Sound present, Soft and no tenderness, no hernia Skin: no rashes  Extremities: bilateral  Pedal edema, no calf tenderness Neurologic: normal without focal findings, mental status, speech normal, alert and oriented x3, PERLA, Motor strength 5/5 and symmetric and sensation grossly normal to light touch Gait not checked due to patient safety concerns  Data Reviewed: CBC: Recent Labs  Lab 04/15/19 1114 04/16/19 0200 04/17/19 0420 04/18/19 0441 04/19/19 0435  WBC 15.2* 14.4* 26.0* 25.0* 20.2*  NEUTROABS 9.0* 12.4* 22.0* 20.8* 15.9*  HGB 16.0 14.5 14.7 13.3 13.7  HCT 45.4 42.1 41.4 38.2* 39.5  MCV 80.5 81.3 80.9 80.8 80.8  PLT 237 267 308 299 299   Basic Metabolic Panel: Recent Labs  Lab 04/15/19 1114 04/16/19 0200 04/17/19 0420 04/18/19 0441 04/19/19 0435  NA 139 139 140 141 141  K 4.2 4.3 4.3 3.8 4.1  CL 99 99 104 105 106  CO2 26 28 27  29 26   GLUCOSE 110* 167* 142* 136* 115*  BUN 15 19 26* 26* 22  CREATININE 1.36* 0.94 1.04 0.97 1.07  CALCIUM 9.4 9.2 9.4 8.8* 8.7*  MG  --  2.4 2.4 2.3 2.3    Liver Function Tests: Recent Labs  Lab 04/15/19 1310 04/16/19 0200 04/17/19 0420 04/18/19 0441 04/19/19 0435  AST 52* 64* 49* 29 23  ALT 53* 62* 77* 61* 53*  ALKPHOS 56 56 57 53 52  BILITOT 0.8 0.6 0.3 0.3 0.4  PROT 6.7 7.2 6.9 6.0* 5.9*  ALBUMIN 3.0* 3.3* 3.1* 2.9* 3.0*   No results for input(s): LIPASE, AMYLASE in the last 168 hours. No results for input(s): AMMONIA in the last 168 hours. Coagulation Profile: No results for input(s): INR, PROTIME in the last 168 hours. Cardiac Enzymes: Recent Labs  Lab 04/15/19 1222  CKTOTAL 76   BNP (last 3 results) No results for input(s): PROBNP in the last 8760 hours. CBG: No results for input(s): GLUCAP in the last 168 hours.  Studies: No results found.   Time spent: 35 minutes  Author: Berle Mull, MD [facility] 04/19/2019 5:54 PM  To reach On-call, see care teams to locate the attending and reach out to them via [email or web address]. If 7PM-7AM, please contact night-coverage If you still have difficulty reaching the attending provider, please page the [facility] (Director on Call) for [facility] on amion for assistance.

## 2019-04-20 LAB — COMPREHENSIVE METABOLIC PANEL
ALT: 48 U/L — ABNORMAL HIGH (ref 0–44)
AST: 20 U/L (ref 15–41)
Albumin: 2.9 g/dL — ABNORMAL LOW (ref 3.5–5.0)
Alkaline Phosphatase: 49 U/L (ref 38–126)
Anion gap: 7 (ref 5–15)
BUN: 20 mg/dL (ref 8–23)
CO2: 27 mmol/L (ref 22–32)
Calcium: 8.6 mg/dL — ABNORMAL LOW (ref 8.9–10.3)
Chloride: 106 mmol/L (ref 98–111)
Creatinine, Ser: 0.96 mg/dL (ref 0.61–1.24)
GFR calc Af Amer: >60 mL/min (ref >60)
GFR calc non Af Amer: >60 mL/min (ref >60)
Glucose, Bld: 133 mg/dL — ABNORMAL HIGH (ref 70–99)
Potassium: 4.2 mmol/L (ref 3.5–5.1)
Sodium: 140 mmol/L (ref 135–145)
Total Bilirubin: 0.2 mg/dL — ABNORMAL LOW (ref 0.3–1.2)
Total Protein: 5.9 g/dL — ABNORMAL LOW (ref 6.5–8.1)

## 2019-04-20 LAB — CBC WITH DIFFERENTIAL/PLATELET
Abs Immature Granulocytes: 0.29 10*3/uL — ABNORMAL HIGH (ref 0.00–0.07)
Basophils Absolute: 0.1 10*3/uL (ref 0.0–0.1)
Basophils Relative: 0 %
Eosinophils Absolute: 0 10*3/uL (ref 0.0–0.5)
Eosinophils Relative: 0 %
HCT: 39.4 % (ref 39.0–52.0)
Hemoglobin: 14 g/dL (ref 13.0–17.0)
Immature Granulocytes: 1 %
Lymphocytes Relative: 12 %
Lymphs Abs: 2.4 10*3/uL (ref 0.7–4.0)
MCH: 28.6 pg (ref 26.0–34.0)
MCHC: 35.5 g/dL (ref 30.0–36.0)
MCV: 80.6 fL (ref 80.0–100.0)
Monocytes Absolute: 1.8 10*3/uL — ABNORMAL HIGH (ref 0.1–1.0)
Monocytes Relative: 9 %
Neutro Abs: 15.7 10*3/uL — ABNORMAL HIGH (ref 1.7–7.7)
Neutrophils Relative %: 78 %
Platelets: 296 10*3/uL (ref 150–400)
RBC: 4.89 MIL/uL (ref 4.22–5.81)
RDW: 14.9 % (ref 11.5–15.5)
WBC: 20.2 10*3/uL — ABNORMAL HIGH (ref 4.0–10.5)
nRBC: 0 % (ref 0.0–0.2)

## 2019-04-20 LAB — C-REACTIVE PROTEIN: CRP: <0.8 mg/dL (ref <1.0)

## 2019-04-20 LAB — FERRITIN: Ferritin: 571 ng/mL — ABNORMAL HIGH (ref 24–336)

## 2019-04-20 LAB — MAGNESIUM: Magnesium: 2.2 mg/dL (ref 1.7–2.4)

## 2019-04-20 LAB — D-DIMER, QUANTITATIVE: D-Dimer, Quant: 0.79 ug/mL-FEU — ABNORMAL HIGH (ref 0.00–0.50)

## 2019-04-20 MED ORDER — BENZONATATE 100 MG PO CAPS: 100.0000 mg | ORAL_CAPSULE | Freq: Three times a day (TID) | ORAL | 0 refills | Status: AC | PRN

## 2019-04-20 MED ORDER — GUAIFENESIN ER 600 MG PO TB12: 600.0000 mg | ORAL_TABLET | Freq: Two times a day (BID) | ORAL | 0 refills | Status: AC

## 2019-04-20 MED ORDER — ENSURE ENLIVE PO LIQD: 237.0000 mL | Freq: Two times a day (BID) | ORAL | 12 refills | Status: AC

## 2019-04-20 MED ORDER — FAMOTIDINE 20 MG PO TABS: 20.0000 mg | ORAL_TABLET | Freq: Every day | ORAL | 0 refills | Status: AC

## 2019-04-20 MED ORDER — DEXAMETHASONE 6 MG PO TABS: 6.0000 mg | ORAL_TABLET | Freq: Every day | ORAL | 0 refills | Status: DC

## 2019-04-20 NOTE — Progress Notes (Signed)
[facility] scheduled for transportation  Kidus Delman, LCSW Transitions of Care Department System Wide Float  [phone]  

## 2019-04-20 NOTE — Progress Notes (Addendum)
Spoke to [employer], [phone] regarding pt's discharge ride home. States she will call me back.   Returned call back. States will set up [facility] to get pt home. Pt informed of same.

## 2019-04-20 NOTE — Discharge Instructions (Signed)
COVID-19: How to Protect Yourself and Others Know how it spreads  There is currently no vaccine to prevent coronavirus disease 2019 (COVID-19).  The best way to prevent illness is to avoid being exposed to this virus.  The virus is thought to spread mainly from person-to-person. ? Between people who are in close contact with one another (within about 6 feet). ? Through respiratory droplets produced when an infected person coughs, sneezes or talks. ? These droplets can land in the mouths or noses of people who are nearby or possibly be inhaled into the lungs. ? Some recent studies have suggested that COVID-19 may be spread by people who are not showing symptoms. Everyone should Clean your hands often  Wash your hands often with soap and water for at least 20 seconds especially after you have been in a public place, or after blowing your nose, coughing, or sneezing.  If soap and water are not readily available, use a hand sanitizer that contains at least 60% alcohol. Cover all surfaces of your hands and rub them together until they feel dry.  Avoid touching your eyes, nose, and mouth with unwashed hands. Avoid close contact  Stay home if you are sick.  Avoid close contact with people who are sick.  Put distance between yourself and other people. ? Remember that some people without symptoms may be able to spread virus. ? This is especially important for people who are at higher risk of getting very RetroStamps.itsick.www.cdc.gov/coronavirus/2019-ncov/need-extra-precautions/people-at-higher-risk.html Cover your mouth and nose with a cloth face cover when around others  You could spread COVID-19 to others even if you do not feel sick.  Everyone should wear a cloth face cover when they have to go out in public, for example to the grocery store or to pick up other necessities. ? Cloth face coverings should not be placed on young children under age 5, anyone who has trouble breathing, or is  unconscious, incapacitated or otherwise unable to remove the mask without assistance.  The cloth face cover is meant to protect other people in case you are infected.  Do NOT use a facemask meant for a Research scientist (physical sciences)healthcare worker.  Continue to keep about 6 feet between yourself and others. The cloth face cover is not a substitute for social distancing. Cover coughs and sneezes  If you are in a private setting and do not have on your cloth face covering, remember to always cover your mouth and nose with a tissue when you cough or sneeze or use the inside of your elbow.  Throw used tissues in the trash.  Immediately wash your hands with soap and water for at least 20 seconds. If soap and water are not readily available, clean your hands with a hand sanitizer that contains at least 60% alcohol. Clean and disinfect  Clean AND disinfect frequently touched surfaces daily. This includes tables, doorknobs, light switches, countertops, handles, desks, phones, keyboards, toilets, faucets, and sinks. ktimeonline.comwww.cdc.gov/coronavirus/2019-ncov/prevent-getting-sick/disinfecting-your-home.html  If surfaces are dirty, clean them: Use detergent or soap and water prior to disinfection.  Then, use a household disinfectant. You can see a list of EPA-registered household disinfectants here. SouthAmericaFlowers.co.ukcdc.gov/coronavirus 02/02/2019 This information is not intended to replace advice given to you by your health care provider. Make sure you discuss any questions you have with your health care provider. Document Released: 01/12/2019 Document Revised: 02/10/2019 Document Reviewed: 01/12/2019 Elsevier Patient Education  2020 [employer] COVID-19 Frequently Asked Questions COVID-19 (coronavirus disease) is an infection that is caused by a large family of  viruses. Some viruses cause illness in people and others cause illness in animals like camels, cats, and bats. In some cases, the viruses that cause illness in animals can spread to  humans. Where did the coronavirus come from? In December 2019, [place] told the [facility]) of several cases of lung disease (human respiratory illness). These cases were linked to an open seafood and livestock market in the city of [place]. The link to the seafood and livestock market suggests that the virus may have spread from animals to humans. However, since that first outbreak in December, the virus has also been shown to spread from person to person. What is the name of the disease and the virus? Disease name Early on, this disease was called novel coronavirus. This is because scientists determined that the disease was caused by a new (novel) respiratory virus. The World Health Organization [facility]) has now named the disease COVID-19, or coronavirus disease. Virus name The virus that causes the disease is called severe acute respiratory syndrome coronavirus 2 (SARS-CoV-2). More information on disease and virus naming World Health Organization [facility]): www.who.int/emergencies/diseases/novel-coronavirus-2019/technical-guidance/naming-the-coronavirus-disease-(covid-2019)-and-the-virus-that-causes-it Who is at risk for complications from coronavirus disease? Some people may be at higher risk for complications from coronavirus disease. This includes older adults and people who have chronic diseases, such as heart disease, diabetes, and lung disease. If you are at higher risk for complications, take these extra precautions:  Avoid close contact with people who are sick or have a fever or cough. Stay at least 3-6 ft (1-2 m) away from them, if possible.  Wash your hands often with soap and water for at least 20 seconds.  Avoid touching your face, mouth, nose, or eyes.  Keep supplies on hand at home, such as food, medicine, and cleaning supplies.  Stay home as much as possible.  Avoid social gatherings and travel. How does coronavirus disease spread? The virus that causes  coronavirus disease spreads easily from person to person (is contagious). There are also cases of community-spread disease. This means the disease has spread to:  People who have no known contact with other infected people.  People who have not traveled to areas where there are known cases. It appears to spread from one person to another through droplets from coughing or sneezing. Can I get the virus from touching surfaces or objects? There is still a lot that we do not know about the virus that causes coronavirus disease. Scientists are basing a lot of information on what they know about similar viruses, such as:  Viruses cannot generally survive on surfaces for long. They need a human body (host) to survive.  It is more likely that the virus is spread by close contact with people who are sick (direct contact), such as through: ? Shaking hands or hugging. ? Breathing in respiratory droplets that travel through the air. This can happen when an infected person coughs or sneezes on or near other people.  It is less likely that the virus is spread when a person touches a surface or object that has the virus on it (indirect contact). The virus may be able to enter the body if the person touches a surface or object and then touches his or her face, eyes, nose, or mouth. Can a person spread the virus without having symptoms of the disease? It may be possible for the virus to spread before a person has symptoms of the disease, but this is most likely not the main way the virus is  spreading. It is more likely for the virus to spread by being in close contact with people who are sick and breathing in the respiratory droplets of a sick person's cough or sneeze. What are the symptoms of coronavirus disease? Symptoms vary from person to person and can range from mild to severe. Symptoms may include:  Fever.  Cough.  Tiredness, weakness, or fatigue.  Fast breathing or feeling short of breath. These  symptoms can appear anywhere from 2 to 14 days after you have been exposed to the virus. If you develop symptoms, call your health care provider. People with severe symptoms may need hospital care. If I am exposed to the virus, how long does it take before symptoms start? Symptoms of coronavirus disease may appear anywhere from 2 to 14 days after a person has been exposed to the virus. If you develop symptoms, call your health care provider. Should I be tested for this virus? Your health care provider will decide whether to test you based on your symptoms, history of exposure, and your risk factors. How does a health care provider test for this virus? Health care providers will collect samples to send for testing. Samples may include:  Taking a swab of fluid from the nose.  Taking fluid from the lungs by having you cough up mucus (sputum) into a sterile cup.  Taking a blood sample.  Taking a stool or urine sample. Is there a treatment or vaccine for this virus? Currently, there is no vaccine to prevent coronavirus disease. Also, there are no medicines like antibiotics or antivirals to treat the virus. A person who becomes sick is given supportive care, which means rest and fluids. A person may also relieve his or her symptoms by using over-the-counter medicines that treat sneezing, coughing, and runny nose. These are the same medicines that a person takes for the common cold. If you develop symptoms, call your health care provider. People with severe symptoms may need hospital care. What can I do to protect myself and my family from this virus?     You can protect yourself and your family by taking the same actions that you would take to prevent the spread of other viruses. Take the following actions:  Wash your hands often with soap and water for at least 20 seconds. If soap and water are not available, use alcohol-based hand sanitizer.  Avoid touching your face, mouth, nose, or  eyes.  Cough or sneeze into a tissue, sleeve, or elbow. Do not cough or sneeze into your hand or the air. ? If you cough or sneeze into a tissue, throw it away immediately and wash your hands.  Disinfect objects and surfaces that you frequently touch every day.  Avoid close contact with people who are sick or have a fever or cough. Stay at least 3-6 ft (1-2 m) away from them, if possible.  Stay home if you are sick, except to get medical care. Call your health care provider before you get medical care.  Make sure your vaccines are up to date. Ask your health care provider what vaccines you need. What should I do if I need to travel? Follow travel recommendations from your local health authority, the CDC, and WHO. Travel information and advice  Centers for Disease Control and Prevention (CDC): GeminiCard.gl  World Health Organization [facility]): PreviewDomains.se Know the risks and take action to protect your health  You are at higher risk of getting coronavirus disease if you are traveling to areas with  an outbreak or if you are exposed to travelers from areas with an outbreak.  Wash your hands often and practice good hygiene to lower the risk of catching or spreading the virus. What should I do if I am sick? General instructions to stop the spread of infection  Wash your hands often with soap and water for at least 20 seconds. If soap and water are not available, use alcohol-based hand sanitizer.  Cough or sneeze into a tissue, sleeve, or elbow. Do not cough or sneeze into your hand or the air.  If you cough or sneeze into a tissue, throw it away immediately and wash your hands.  Stay home unless you must get medical care. Call your health care provider or local health authority before you get medical care.  Avoid public areas. Do not take public transportation, if possible.  If you can, wear  a mask if you must go out of the house or if you are in close contact with someone who is not sick. Keep your home clean  Disinfect objects and surfaces that are frequently touched every day. This may include: ? Counters and tables. ? Doorknobs and light switches. ? Sinks and faucets. ? Electronics such as phones, remote controls, keyboards, computers, and tablets.  Wash dishes in hot, soapy water or use a dishwasher. Air-dry your dishes.  Wash laundry in hot water. Prevent infecting other household members  Let healthy household members care for children and pets, if possible. If you have to care for children or pets, wash your hands often and wear a mask.  Sleep in a different bedroom or bed, if possible.  Do not share personal items, such as razors, toothbrushes, deodorant, combs, brushes, towels, and washcloths. Where to find more information Centers for Disease Control and Prevention (CDC)  Information and news updates: CardRetirement.czwww.cdc.gov/coronavirus/2019-ncov World Health Organization [facility](WHO)  Information and news updates: AffordableSalon.eswww.who.int/emergencies/diseases/novel-coronavirus-2019  Coronavirus health topic: https://thompson-craig.com/www.who.int/health-topics/coronavirus  Questions and answers on COVID-19: kruiseway.comwww.who.int/news-room/q-a-detail/q-a-coronaviruses  Global tracker: who.sprinklr.com American Academy of Pediatrics (AAP)  Information for families: www.healthychildren.org/English/health-issues/conditions/chest-lungs/Pages/2019-Novel-Coronavirus.aspx The coronavirus situation is changing rapidly. Check your local health authority website or the CDC and [facility] websites for updates and news. When should I contact a health care provider?  Contact your health care provider if you have symptoms of an infection, such as fever or cough, and you: ? Have been near anyone who is known to have coronavirus disease. ? Have come into contact with a person who is suspected to have coronavirus disease. ? Have traveled  outside of the country. When should I get emergency medical care?  Get help right away by calling your local emergency services (911 in the [place] if you have: ? Trouble breathing. ? Pain or pressure in your chest. ? Confusion. ? Blue-tinged lips and fingernails. ? Difficulty waking from sleep. ? Symptoms that get worse. Let the emergency medical personnel know if you think you have coronavirus disease. Summary  A new respiratory virus is spreading from person to person and causing COVID-19 (coronavirus disease).  The virus that causes COVID-19 appears to spread easily. It spreads from one person to another through droplets from coughing or sneezing.  Older adults and those with chronic diseases are at higher risk of disease. If you are at higher risk for complications, take extra precautions.  There is currently no vaccine to prevent coronavirus disease. There are no medicines, such as antibiotics or antivirals, to treat the virus.  You can protect yourself and your family by washing your  hands often, avoiding touching your face, and covering your coughs and sneezes. This information is not intended to replace advice given to you by your health care provider. Make sure you discuss any questions you have with your health care provider. Document Released: 01/12/2019 Document Revised: 01/12/2019 Document Reviewed: 01/12/2019 Elsevier Patient Education  2020 Elsevier Inc.  COVID-19: How to Protect Yourself and Others Know how it spreads  There is currently no vaccine to prevent coronavirus disease 2019 (COVID-19).  The best way to prevent illness is to avoid being exposed to this virus.  The virus is thought to spread mainly from person-to-person. ? Between people who are in close contact with one another (within about 6 feet). ? Through respiratory droplets produced when an infected person coughs, sneezes or talks. ? These droplets can land in the mouths or noses of people who are  nearby or possibly be inhaled into the lungs. ? Some recent studies have suggested that COVID-19 may be spread by people who are not showing symptoms. Everyone should Clean your hands often  Wash your hands often with soap and water for at least 20 seconds especially after you have been in a public place, or after blowing your nose, coughing, or sneezing.  If soap and water are not readily available, use a hand sanitizer that contains at least 60% alcohol. Cover all surfaces of your hands and rub them together until they feel dry.  Avoid touching your eyes, nose, and mouth with unwashed hands. Avoid close contact  Stay home if you are sick.  Avoid close contact with people who are sick.  Put distance between yourself and other people. ? Remember that some people without symptoms may be able to spread virus. ? This is especially important for people who are at higher risk of getting very RetroStamps.itsick.www.cdc.gov/coronavirus/2019-ncov/need-extra-precautions/people-at-higher-risk.html Cover your mouth and nose with a cloth face cover when around others  You could spread COVID-19 to others even if you do not feel sick.  Everyone should wear a cloth face cover when they have to go out in public, for example to the grocery store or to pick up other necessities. ? Cloth face coverings should not be placed on young children under age 10, anyone who has trouble breathing, or is unconscious, incapacitated or otherwise unable to remove the mask without assistance.  The cloth face cover is meant to protect other people in case you are infected.  Do NOT use a facemask meant for a Research scientist (physical sciences)healthcare worker.  Continue to keep about 6 feet between yourself and others. The cloth face cover is not a substitute for social distancing. Cover coughs and sneezes  If you are in a private setting and do not have on your cloth face covering, remember to always cover your mouth and nose with a tissue when you cough or sneeze  or use the inside of your elbow.  Throw used tissues in the trash.  Immediately wash your hands with soap and water for at least 20 seconds. If soap and water are not readily available, clean your hands with a hand sanitizer that contains at least 60% alcohol. Clean and disinfect  Clean AND disinfect frequently touched surfaces daily. This includes tables, doorknobs, light switches, countertops, handles, desks, phones, keyboards, toilets, faucets, and sinks. ktimeonline.comwww.cdc.gov/coronavirus/2019-ncov/prevent-getting-sick/disinfecting-your-home.html  If surfaces are dirty, clean them: Use detergent or soap and water prior to disinfection.  Then, use a household disinfectant. You can see a list of EPA-registered household disinfectants here. SouthAmericaFlowers.co.ukcdc.gov/coronavirus 02/02/2019 This information is not  intended to replace advice given to you by your health care provider. Make sure you discuss any questions you have with your health care provider. Document Released: 01/12/2019 Document Revised: 02/10/2019 Document Reviewed: 01/12/2019 Elsevier Patient Education  2020 [employer].

## 2019-04-20 NOTE — Progress Notes (Signed)
Nsg Discharge Note  Admit Date:  04/15/2019 Discharge date: 04/20/2019   Tracy Fritz to be D/C'd Home per MD order.  AVS completed.  Copy for chart, and copy for patient signed, and dated. Patient/caregiver able to verbalize understanding.  Discharge Medication: Allergies as of 04/20/2019   No Known Allergies     Medication List    STOP taking these medications   amLODipine 10 MG tablet Commonly known as: NORVASC     TAKE these medications   benzonatate 100 MG capsule Commonly known as: TESSALON Take 1 capsule (100 mg total) by mouth 3 (three) times daily as needed for cough.   colchicine 0.6 MG tablet Take 0.6 mg by mouth as directed. Take 2 tablets by mouth at onset of gout attack, then 1 tablet 1 hour later for a total of 3 tablets   dexamethasone 6 MG tablet Commonly known as: DECADRON Take 1 tablet (6 mg total) by mouth daily with breakfast.   famotidine 20 MG tablet Commonly known as: PEPCID Take 1 tablet (20 mg total) by mouth daily.   feeding supplement (ENSURE ENLIVE) Liqd Take 237 mLs by mouth 2 (two) times daily between meals.   guaiFENesin 600 MG 12 hr tablet Commonly known as: MUCINEX Take 1 tablet (600 mg total) by mouth 2 (two) times daily for 10 days.   terazosin 2 MG capsule Commonly known as: HYTRIN Take 4 mg by mouth at bedtime.       Discharge Assessment: Vitals:   04/20/19 0803 04/20/19 0815  BP:  125/77  Pulse: (!) 41 (!) 45  Resp: 20 15  Temp:    SpO2: 94% 98%   Skin clean, dry and intact without evidence of skin break down, no evidence of skin tears noted. IV catheter discontinued intact. Site without signs and symptoms of complications - no redness or edema noted at insertion site, patient denies c/o pain - only slight tenderness at site.  Dressing with slight pressure applied.  D/c Instructions-Education: Discharge instructions given to patient/family with verbalized understanding. D/c education completed with patient/family  including follow up instructions, medication list, d/c activities limitations if indicated, with other d/c instructions as indicated by MD - patient able to verbalize understanding, all questions fully answered. Patient instructed to return to ED, call 911, or call MD for any changes in condition.  Patient escorted via [place], and D/C home via private auto.  Eda Keys, RN 04/20/2019 9:29 AM

## 2019-04-20 NOTE — Discharge Summary (Signed)
[facility] Discharge Summary   Patient: Tracy Fritz IHK:[id number]   PCP: [facility] DOB: 1956/04/20   Date of admission: 04/15/2019   Date of discharge: 04/20/2019     Discharge Diagnoses:   Principal Problem:   Pneumonia due to COVID-19 virus Active Problems:   Essential hypertension   BPH (benign prostatic hyperplasia)   Gout   Admitted From: home Disposition:  Home   Recommendations for Outpatient Follow-up:  1. Please follow up with PCP in  week   Follow-up Information    [facility]. Schedule an appointment as soon as possible for a visit in 1 week(s).   Contact information: [address] [place] [address] [phone]          Diet recommendation: carb modified diet  Activity: The patient is advised to gradually reintroduce usual activities,as tolerated .  Discharge Condition: good  Code Status: full code  History of present illness: As per the H and P dictated on admission, "Tracy PouchHerman J Duhon is an 63 y.o. male with past medical history significant for hypertension who was in his usual state of good health until the day he retired which was 6 days ago when he developed onset of fatigue.  He notes that he was unable to enjoy his food because the food tasted very salty.  Over the next several days patient developed profound anorexia, nonproductive cough and increased weakness About 3 days ago developed shortness of breath.  Patient notes he is very active at baseline however he has been mostly sitting around his house and as of 3 days ago he noted he was short of breath just sitting around.  Of note his partner also has been feeling unwell and she was admitted to the hospital yesterday with pneumonia.  Her COVID test is not yet available.  Patient denies any fevers or chills or sweats.  He denies any chest pain.  Does admit to some difficulty sleeping because he states when he sleeps on his side he feels a heaviness on his  chest and has to turn over to the other side for the heaviness to ease off.  Patient notes his cough is nonproductive although he does have some chest discomfort with coughing."  Hospital Course:  Summary of his active problems in the hospital is as following. 1. Acute COVID-19 Viral illness Acute hypoxic respiratory failure. Recent Labs[] Expand by Default       Lab Results  Component Value Date   SARSCOV2NAA POSITIVE (A) 04/15/2019     CXR: hazy bilateral peripheral opacities  Recent Labs (last 2 labs) [] Expand by Default      Recent Labs    04/18/19 0441 04/19/19 0435  DDIMER 0.71* 0.82*  FERRITIN 790* 627*  CRP 1.2* 0.9     Tmax last 24 hours: 98.0 Oxygen requirements: On room air  Antibiotics: None Diuretics: None Vitamin C and Zinc: Continue DVT Prophylaxis: Subcutaneous Lovenox weight based. Remdesivir: Started on 04/15/2019 Steroids: Solu-Medrol IV started on 04/15/2019, switched to Decadron Actemra: Currently no indication for off-label use of Actemra:  PPE During this encounter: Patient Isolation: Airborne + Droplet + Contact HCP PPE: CAPR, gown. gloves Patient PPE: None  The treatment plan and use of medications and known side effects were discussed with patient/family. It was clearly explained that there is no proven definitive treatment for COVID-19 infection yet. Any medications used here are based on case reports/anecdotal data which are not peer-reviewed and has not been studied using randomized control trials.  Complete risks  and long-term side effects are unknown, however in the best clinical judgment they seem to be of some clinical benefit rather than medical risks.  Patient/family agree with the treatment plan and want to receive these treatments as indicated.  2.  Essential hypertension. Blood pressure stable. Currently holding home meds  3.  BPH. Continue Flomax.  4. History of gout. Currently no evidence of flare. Monitor.  5.   Obesity Body mass index is 34.26 kg/m.  Nutrition Problem: Increased nutrient needs Etiology: acute illness(COVID) Interventions: Interventions: Refer to RD note for recommendations  Patient was ambulatory without any assistance. On the day of the discharge the patient's vitals were stable, and no other acute medical condition were reported by patient. the patient was felt safe to be discharge at Home with no therapy needed on discharge.  Consultants: none Procedures: noen  DISCHARGE MEDICATION: Allergies as of 04/20/2019   No Known Allergies     Medication List    STOP taking these medications   amLODipine 10 MG tablet Commonly known as: NORVASC     TAKE these medications   benzonatate 100 MG capsule Commonly known as: TESSALON Take 1 capsule (100 mg total) by mouth 3 (three) times daily as needed for cough.   colchicine 0.6 MG tablet Take 0.6 mg by mouth as directed. Take 2 tablets by mouth at onset of gout attack, then 1 tablet 1 hour later for a total of 3 tablets   dexamethasone 6 MG tablet Commonly known as: DECADRON Take 1 tablet (6 mg total) by mouth daily with breakfast.   famotidine 20 MG tablet Commonly known as: PEPCID Take 1 tablet (20 mg total) by mouth daily.   feeding supplement (ENSURE ENLIVE) Liqd Take 237 mLs by mouth 2 (two) times daily between meals.   guaiFENesin 600 MG 12 hr tablet Commonly known as: MUCINEX Take 1 tablet (600 mg total) by mouth 2 (two) times daily for 10 days.   terazosin 2 MG capsule Commonly known as: HYTRIN Take 4 mg by mouth at bedtime.      No Known Allergies Discharge Instructions    Diet - low sodium heart healthy   Complete by: As directed    Discharge instructions   Complete by: As directed    It is important that you read the given instructions as well as go over your medication list with RN to help you understand your care after this hospitalization.  Discharge Instructions: Please follow-up with PCP in  1-2 weeks  Please request your primary care physician to go over all Hospital Tests and Procedure/Radiological results at the follow up. Please get all Hospital records sent to your PCP by signing hospital release before you go home.  Do not take more than prescribed Pain, Sleep and Anxiety Medications. You were cared for by a hospitalist during your hospital stay. If you have any questions about your discharge medications or the care you received while you were in the hospital after you are discharged, you can call the unit @UNIT @ you were admitted to and ask to speak with the hospitalist on call if the hospitalist that took care of you is not available.  Once you are discharged, your primary care physician will handle any further medical issues. Please note that NO REFILLS for any discharge medications will be authorized once you are discharged, as it is imperative that you return to your primary care physician (or establish a relationship with a primary care physician if you do not have one)  for your aftercare needs so that they can reassess your need for medications and monitor your lab values. You Must read complete instructions/literature along with all the possible adverse reactions/side effects for all the Medicines you take and that have been prescribed to you. Take any new Medicines after you have completely understood and accept all the possible adverse reactions/side effects. Wear Seat belts while driving. If you have smoked or chewed Tobacco in the last 2 yrs please stop smoking and/or stop any Recreational drug use.  If you drink alcohol, please moderate the use and do not drive, operating heavy machinery, perform activities at heights, swimming or participation in water activities or provide baby sitting services under influence.   Increase activity slowly   Complete by: As directed      Discharge Exam: Filed Weights   04/15/19 1053  Weight: 105.2 kg   Vitals:   04/20/19 0803  04/20/19 0815  BP:  125/77  Pulse: (!) 41 (!) 45  Resp: 20 15  Temp:    SpO2: 94% 98%   General: Appear in no distress, no Rash; Oral Mucosa Clear, moist. no Abnormal Mass Or lumps Cardiovascular: S1 and S2 Present, no Murmur, Respiratory: normal respiratory effort, Bilateral Air entry present and faint bilateral Crackles, no wheezes Abdomen: Bowel Sound present, Soft and no tenderness, no hernia Extremities: no Pedal edema, no calf tenderness Neurology: alert and oriented to time, place, and person affect appropriate. normal without focal findings, mental status, speech normal, alert and oriented x3, PERLA, Motor strength 5/5 and symmetric and sensation grossly normal to light touch   The results of significant diagnostics from this hospitalization (including imaging, microbiology, ancillary and laboratory) are listed below for reference.    Significant Diagnostic Studies: Dg Chest Port 1 View  Result Date: 04/15/2019 CLINICAL DATA:  Shortness of breath EXAM: PORTABLE CHEST 1 VIEW COMPARISON:  None. FINDINGS: Cardiac shadow is enlarged but accentuated by the portable technique. Mild bibasilar infiltrates are seen. No sizable effusion is noted. No bony abnormality is noted. IMPRESSION: Bibasilar infiltrates. Electronically Signed   By: Inez Catalina M.D.   On: 04/15/2019 12:02    Microbiology: Recent Results (from the past 240 hour(s))  SARS Coronavirus 2 (CEPHEID- Performed in [facility] hospital lab), Hosp Order     Status: Abnormal   Collection Time: 04/15/19 11:32 AM   Specimen: Nasopharyngeal Swab  Result Value Ref Range Status   SARS Coronavirus 2 POSITIVE (A) NEGATIVE Final    Comment: RESULT CALLED TO, READ BACK BY AND VERIFIED WITH: Lowell Bouton RN 13:50 04/15/19 ([email or web address]) (NOTE) If result is NEGATIVE SARS-CoV-2 target nucleic acids are NOT DETECTED. The SARS-CoV-2 RNA is generally detectable in upper and lower  respiratory specimens during the acute phase of infection. The  lowest  concentration of SARS-CoV-2 viral copies this assay can detect is 250  copies / mL. A negative result does not preclude SARS-CoV-2 infection  and should not be used as the sole basis for treatment or other  patient management decisions.  A negative result may occur with  improper specimen collection / handling, submission of specimen other  than nasopharyngeal swab, presence of viral mutation(s) within the  areas targeted by this assay, and inadequate number of viral copies  (<250 copies / mL). A negative result must be combined with clinical  observations, patient history, and epidemiological information. If result is POSITIVE SARS-CoV-2 target nucleic acids are DETECTED. Th e SARS-CoV-2 RNA is generally detectable in upper and lower  respiratory specimens  during the acute phase of infection.  Positive  results are indicative of active infection with SARS-CoV-2.  Clinical  correlation with patient history and other diagnostic information is  necessary to determine patient infection status.  Positive results do  not rule out bacterial infection or co-infection with other viruses. If result is PRESUMPTIVE POSTIVE SARS-CoV-2 nucleic acids MAY BE PRESENT.   A presumptive positive result was obtained on the submitted specimen  and confirmed on repeat testing.  While 2019 novel coronavirus  (SARS-CoV-2) nucleic acids may be present in the submitted sample  additional confirmatory testing may be necessary for epidemiological  and / or clinical management purposes  to differentiate between  SARS-CoV-2 and other Sarbecovirus currently known to infect humans.  If clinically indicated additional testing with an alternate test  methodology [phone]([id number]) is  advised. The SARS-CoV-2 RNA is generally  detectable in upper and lower respiratory specimens during the acute  phase of infection. The expected result is Negative. Fact Sheet for Patients:  BoilerBrush.com.cyhttps://www.fda.gov/media/136312/download  Fact Sheet for Healthcare Providers: https://pope.com/https://www.fda.gov/media/136313/download This test is not yet approved or cleared by the [place] FDA and has been authorized for detection and/or diagnosis of SARS-CoV-2 by FDA under an Emergency Use Authorization (EUA).  This EUA will remain in effect (meaning this test can be used) for the duration of the COVID-19 declaration under Section 564(b)(1) of the Act, 21 U.S.C. section 360bbb-3(b)(1), unless the authorization is terminated or revoked sooner. Performed at [facility], [address] [place], [place] [address]      Labs: CBC: Recent Labs  Lab 04/16/19 0200 04/17/19 0420 04/18/19 0441 04/19/19 0435 04/20/19 0400  WBC 14.4* 26.0* 25.0* 20.2* 20.2*  NEUTROABS 12.4* 22.0* 20.8* 15.9* 15.7*  HGB 14.5 14.7 13.3 13.7 14.0  HCT 42.1 41.4 38.2* 39.5 39.4  MCV 81.3 80.9 80.8 80.8 80.6  PLT 267 308 299 299 296   Basic Metabolic Panel: Recent Labs  Lab 04/16/19 0200 04/17/19 0420 04/18/19 0441 04/19/19 0435 04/20/19 0400  NA 139 140 141 141 140  K 4.3 4.3 3.8 4.1 4.2  CL 99 104 105 106 106  CO2 28 27 29 26 27   GLUCOSE 167* 142* 136* 115* 133*  BUN 19 26* 26* 22 20  CREATININE 0.94 1.04 0.97 1.07 0.96  CALCIUM 9.2 9.4 8.8* 8.7* 8.6*  MG 2.4 2.4 2.3 2.3 2.2   Liver Function Tests: Recent Labs  Lab 04/16/19 0200 04/17/19 0420 04/18/19 0441 04/19/19 0435 04/20/19 0400  AST 64* 49* 29 23 20   ALT 62* 77* 61* 53* 48*  ALKPHOS 56 57 53 52 49  BILITOT 0.6 0.3 0.3 0.4 0.2*  PROT 7.2 6.9 6.0* 5.9* 5.9*  ALBUMIN 3.3* 3.1* 2.9* 3.0* 2.9*   No results for input(s): LIPASE, AMYLASE in the last 168 hours. No results for input(s): AMMONIA in the last 168 hours. Cardiac Enzymes: Recent Labs  Lab 04/15/19 1222  CKTOTAL 76   BNP (last 3 results) Recent Labs    04/15/19 1114  BNP 26.6   CBG: No results for input(s): GLUCAP in the last 168 hours. Time spent: 35 minutes  Signed:  Lynden Oxfordranav Kameryn Davern  [facility] 04/20/2019

## 2019-12-13 ENCOUNTER — Ambulatory Visit: Payer: No Typology Code available for payment source | Attending: Internal Medicine

## 2019-12-13 DIAGNOSIS — Z23 Encounter for immunization: Secondary | ICD-10-CM

## 2019-12-13 NOTE — Progress Notes (Signed)
   Covid-19 Vaccination Clinic  Name:  OPIE MACLAUGHLIN    MRN: [id number] DOB: 1956-07-20  12/13/2019  Mr. Dowell was observed post Covid-19 immunization for 15 minutes without incident. He was provided with Vaccine Information Sheet and instruction to access the V-Safe system.   Mr. Qualley was instructed to call 911 with any severe reactions post vaccine: Marland Kitchen Difficulty breathing  . Swelling of face and throat  . A fast heartbeat  . A bad rash all over body  . Dizziness and weakness   Immunizations Administered    Name Date Dose VIS Date Route   Pfizer COVID-19 Vaccine 12/13/2019  4:02 PM 0.3 mL 09/10/2019 Intramuscular   Manufacturer: [employer], [employer]   Lot: [id number]   NDC: [address]-1

## 2020-01-04 ENCOUNTER — Ambulatory Visit: Payer: No Typology Code available for payment source | Attending: Internal Medicine

## 2020-01-04 ENCOUNTER — Ambulatory Visit: Payer: Self-pay

## 2020-01-04 DIAGNOSIS — Z23 Encounter for immunization: Secondary | ICD-10-CM

## 2020-01-04 NOTE — Progress Notes (Signed)
   Covid-19 Vaccination Clinic  Name:  Tracy Fritz    MRN: [id number] DOB: [birth year]  01/04/2020  [name] was observed post Covid-19 immunization for 15 minutes without incident. He was provided with Vaccine Information Sheet and instruction to access the V-Safe system.   [name] was instructed to call 911 with any severe reactions post vaccine: Marland Kitchen Difficulty breathing  . Swelling of face and throat  . A fast heartbeat  . A bad rash all over body  . Dizziness and weakness   Immunizations Administered    Name Date Dose VIS Date Route   Pfizer COVID-19 Vaccine 01/04/2020  4:15 PM 0.3 mL 09/10/2019 Intramuscular   Manufacturer: [employer], [employer]   Lot: [id number]   NDC: [address]-3

## 2020-11-23 ENCOUNTER — Ambulatory Visit (HOSPITAL_COMMUNITY)
Admission: EM | Admit: 2020-11-23 | Discharge: 2020-11-23 | Disposition: A | Discharge status: Home / Self Care | Payer: No Typology Code available for payment source | Attending: Emergency Medicine | Admitting: Emergency Medicine

## 2020-11-23 ENCOUNTER — Encounter (HOSPITAL_COMMUNITY): Payer: Self-pay | Admitting: Emergency Medicine

## 2020-11-23 ENCOUNTER — Other Ambulatory Visit: Payer: Self-pay

## 2020-11-23 DIAGNOSIS — M5441 Lumbago with sciatica, right side: Secondary | ICD-10-CM

## 2020-11-23 DIAGNOSIS — M5442 Lumbago with sciatica, left side: Secondary | ICD-10-CM | POA: Diagnosis not present

## 2020-11-23 DIAGNOSIS — G8929 Other chronic pain: Secondary | ICD-10-CM

## 2020-11-23 MED ORDER — CYCLOBENZAPRINE HCL 5 MG PO TABS: 5.0000 mg | ORAL_TABLET | Freq: Two times a day (BID) | ORAL | 0 refills | Status: AC | PRN

## 2020-11-23 MED ORDER — KETOROLAC TROMETHAMINE 30 MG/ML IJ SOLN
INTRAMUSCULAR | Status: AC
  Filled 2020-11-23: qty 1

## 2020-11-23 MED ORDER — NAPROXEN 500 MG PO TABS: 500.0000 mg | ORAL_TABLET | Freq: Two times a day (BID) | ORAL | 0 refills | Status: AC

## 2020-11-23 MED ORDER — KETOROLAC TROMETHAMINE 30 MG/ML IJ SOLN
30.0000 mg | Freq: Once | INTRAMUSCULAR | Status: AC
  Administered 2020-11-23: 30 mg via INTRAMUSCULAR

## 2020-11-23 NOTE — ED Triage Notes (Signed)
Pt presents with low back pain and leg pain xs 2 weeks. States pain radiates down into legs.

## 2020-11-23 NOTE — Discharge Instructions (Addendum)
Can use naprosyn twice a day   Can use muscle relaxer twice a day as needed  Can use heating pad 15 minute intervals  Gentle stretching as tolerated  Follow up if worsening or persistent pain or increased numbness

## 2020-11-23 NOTE — ED Provider Notes (Signed)
[facility]    CSN: [id number] Arrival date & time: 11/23/20  1238      History   Chief Complaint Chief Complaint  Patient presents with  . Back Pain  . Leg Pain    HPI Tracy Fritz is a 65 y.o. male.   Patient presents with bilateral lower back pain 10/10 radiating to bilateral legs and tingling present in left foot. Denies numbness, changes in urinary/bowel habits. Worsening over the last 5 days, initially began after standing for a few hours. ROM intact but pain elicited. History of sciatic pain. Was using Naproxen at home with some relief but ran out of medicine.    Past Medical History:  Diagnosis Date  . Hypertension     Patient Active Problem List   Diagnosis Date Noted  . Pneumonia due to COVID-19 virus 04/15/2019  . Essential hypertension 04/15/2019  . BPH (benign prostatic hyperplasia) 04/15/2019  . Gout 04/15/2019    Past Surgical History:  Procedure Laterality Date  . KNEE SURGERY         Home Medications    Prior to Admission medications   Medication Sig Start Date End Date Taking? Authorizing Provider  cyclobenzaprine (FLEXERIL) 5 MG tablet Take 1 tablet (5 mg total) by mouth 2 (two) times daily as needed for muscle spasms. 11/23/20  Yes Deniz Hannan R, NP  naproxen (NAPROSYN) 500 MG tablet Take 1 tablet (500 mg total) by mouth 2 (two) times daily. 11/23/20  Yes Nakisha Chai, Elita Boone, NP  benzonatate (TESSALON) 100 MG capsule Take 1 capsule (100 mg total) by mouth 3 (three) times daily as needed for cough. 04/20/19   Rolly Salter, MD  colchicine 0.6 MG tablet Take 0.6 mg by mouth as directed. Take 2 tablets by mouth at onset of gout attack, then 1 tablet 1 hour later for a total of 3 tablets    [provider]  dexamethasone (DECADRON) 6 MG tablet Take 1 tablet (6 mg total) by mouth daily with breakfast. 04/20/19   Rolly Salter, MD  famotidine (PEPCID) 20 MG tablet Take 1 tablet (20 mg total) by mouth daily. 04/20/19   Rolly Salter, MD  feeding supplement, ENSURE ENLIVE, (ENSURE ENLIVE) LIQD Take 237 mLs by mouth 2 (two) times daily between meals. 04/20/19   Rolly Salter, MD  terazosin (HYTRIN) 2 MG capsule Take 4 mg by mouth at bedtime.    [provider]    Family History Family History  Problem Relation Age of Onset  . Stroke Mother   . Cancer Father     Social History Social History   Tobacco Use  . Smoking status: Never Smoker  . Smokeless tobacco: Never Used  Substance Use Topics  . Alcohol use: Yes    Comment: occasionally  . Drug use: No     Allergies   Patient has no known allergies.   Review of Systems Review of Systems  Constitutional: Negative.   HENT: Negative.   Respiratory: Negative.   Cardiovascular: Negative.   Genitourinary: Negative.   Musculoskeletal: Positive for back pain. Negative for arthralgias, gait problem, joint swelling, myalgias, neck pain and neck stiffness.  Skin: Negative.   Neurological: Negative.      Physical Exam Triage Vital Signs ED Triage Vitals  Enc Vitals Group     BP 11/23/20 1302 138/84     Pulse Rate 11/23/20 1302 (!) 104     Resp 11/23/20 1302 19     Temp 11/23/20  1302 98.2 F (36.8 C)     Temp Source 11/23/20 1302 Oral     SpO2 11/23/20 1302 97 %     Weight --      Height --      Head Circumference --      Peak Flow --      Pain Score 11/23/20 1301 10     Pain Loc --      Pain Edu? --      Excl. in GC? --    No data found.  Updated Vital Signs BP 138/84 (BP Location: Right Arm)   Pulse (!) 104   Temp 98.2 F (36.8 C) (Oral)   Resp 19   SpO2 97%   Visual Acuity Right Eye Distance:   Left Eye Distance:   Bilateral Distance:    Right Eye Near:   Left Eye Near:    Bilateral Near:     Physical Exam Constitutional:      Appearance: Normal appearance. He is normal weight.  HENT:     Head: Normocephalic.  Eyes:     Extraocular Movements: Extraocular movements intact.  Pulmonary:     Effort:  Pulmonary effort is normal.  Musculoskeletal:     Cervical back: Normal and normal range of motion.     Thoracic back: Normal.     Lumbar back: Tenderness present. No swelling, edema, signs of trauma, spasms or bony tenderness. Normal range of motion. Positive right straight leg raise test and positive left straight leg raise test.       Back:  Skin:    General: Skin is warm and dry.  Neurological:     General: No focal deficit present.     Mental Status: He is alert and oriented to person, place, and time. Mental status is at baseline.  Psychiatric:        Mood and Affect: Mood normal.        Behavior: Behavior normal.        Judgment: Judgment normal.      UC Treatments / Results  Labs (all labs ordered are listed, but only abnormal results are displayed) Labs Reviewed - No data to display  EKG   Radiology No results found.  Procedures Procedures (including critical care time)  Medications Ordered in UC Medications  ketorolac (TORADOL) 30 MG/ML injection 30 mg (has no administration in time range)    Initial Impression / Assessment and Plan / UC Course  I have reviewed the triage vital signs and the nursing notes.  Pertinent labs & imaging results that were available during my care of the patient were reviewed by me and considered in my medical decision making (see chart for details).  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 Naproxen 500mg  bid for 15 days  2. Flexeril 5 mg bid for 7 days as needed 3. IM 30 mg toradol today  4. Heating pad 15 minute intervals 5. ROM stretching as tolerated.  Final Clinical Impressions(s) / UC Diagnoses   Final diagnoses:  Chronic bilateral low back pain with bilateral sciatica     Discharge Instructions     Can use naprosyn twice a day   Can use muscle relaxer twice a day as needed  Can use heating pad 15 minute intervals  Gentle stretching as tolerated  Follow up if worsening or persistent pain or  increased numbness   ED Prescriptions    Medication Sig Dispense Auth. Provider   naproxen (NAPROSYN) 500 MG tablet Take 1  tablet (500 mg total) by mouth 2 (two) times daily. 30 tablet Moira Umholtz R, NP   cyclobenzaprine (FLEXERIL) 5 MG tablet Take 1 tablet (5 mg total) by mouth 2 (two) times daily as needed for muscle spasms. 14 tablet Kohana Amble, Elita Boone, NP     PDMP not reviewed this encounter.   Valinda Hoar, NP 11/23/20 1340

## 2020-11-27 ENCOUNTER — Other Ambulatory Visit: Payer: Self-pay

## 2020-11-27 ENCOUNTER — Telehealth (HOSPITAL_COMMUNITY): Payer: Self-pay | Admitting: Emergency Medicine

## 2020-11-27 ENCOUNTER — Ambulatory Visit (INDEPENDENT_AMBULATORY_CARE_PROVIDER_SITE_OTHER): Payer: No Typology Code available for payment source

## 2020-11-27 ENCOUNTER — Encounter (HOSPITAL_COMMUNITY): Payer: Self-pay | Admitting: Emergency Medicine

## 2020-11-27 ENCOUNTER — Ambulatory Visit (HOSPITAL_COMMUNITY)
Admission: EM | Admit: 2020-11-27 | Discharge: 2020-11-27 | Disposition: A | Discharge status: Home / Self Care | Payer: No Typology Code available for payment source | Attending: Family Medicine | Admitting: Family Medicine

## 2020-11-27 DIAGNOSIS — M545 Low back pain, unspecified: Secondary | ICD-10-CM

## 2020-11-27 DIAGNOSIS — M79601 Pain in right arm: Secondary | ICD-10-CM | POA: Diagnosis not present

## 2020-11-27 DIAGNOSIS — M84421A Pathological fracture, right humerus, initial encounter for fracture: Secondary | ICD-10-CM

## 2020-11-27 DIAGNOSIS — M25511 Pain in right shoulder: Secondary | ICD-10-CM

## 2020-11-27 DIAGNOSIS — N451 Epididymitis: Secondary | ICD-10-CM

## 2020-11-27 MED ORDER — LEVOFLOXACIN 500 MG PO TABS: 500.0000 mg | ORAL_TABLET | Freq: Every day | ORAL | 0 refills | Status: AC

## 2020-11-27 MED ORDER — PREDNISONE 20 MG PO TABS: 40.0000 mg | ORAL_TABLET | Freq: Every day | ORAL | 0 refills | Status: DC

## 2020-11-27 NOTE — Discharge Instructions (Addendum)

## 2020-11-27 NOTE — Telephone Encounter (Signed)
Received call from Radiologist in regards to Shoulder xray. Dr Tracie Harrier notified.

## 2020-11-27 NOTE — ED Provider Notes (Signed)
[facility] CARE CENTER   [id number] 11/27/20 Arrival Time: 0905  ASSESSMENT & PLAN:  1. Acute bilateral low back pain without sciatica   2. Acute pain of right shoulder   3. Epididymitis   4. Pathological fracture of right humerus, unspecified pathological cause, initial encounter     I have personally viewed the imaging studies ordered this visit. 1) Degenerative changes on lumbar spine films. No fractures appreciated. 2) Apparent pathological fx of prox humerus. Discussed need for MRI. He will call sports medicine or his primary to set up. ED is an option if any worsening. He voices understanding. My buy sling over the counter to use for comfort.  No signs of testicular torsion; very TTP over posterior scrotum without appreciable masses.  Begin: Meds ordered this encounter  Medications  . predniSONE (DELTASONE) 20 MG tablet    Sig: Take 2 tablets (40 mg total) by mouth daily.    Dispense:  10 tablet    Refill:  0  . levofloxacin (LEVAQUIN) 500 MG tablet    Sig: Take 1 tablet (500 mg total) by mouth daily.    Dispense:  10 tablet    Refill:  0    Recommend:  Follow-up Information    [facility].   Why: If your back pain and/or shoulder pain is worsening or failing to improve as anticipated. Contact information: [address] [place] [address] [phone]       [facility].   Why: If your scrotal pain is failing to improve as anticipated. Contact information: [address] [address] [place] [address] [phone]       [facility] EMERGENCY DEPARTMENT.   Specialty: Emergency Medicine Why: If your scrotal pain worsens in any way. Contact information: [address] [id number] [place] [address] [phone]              Reviewed expectations re: course of current medical issues. Questions answered. Outlined signs and symptoms  indicating need for more acute intervention. Patient verbalized understanding. After Visit Summary given.  SUBJECTIVE: History from: patient. Tracy Fritz is a 65 y.o. male who reports:  1) Continuing low back pain. Seen here on 11/23/2020; note reviewed. IM Toradol + NSAID/Flexeril. Mild help but back still sore. Ambulatory without difficulty. No extremity sensation changes or weakness. Pain worse with prolonged standing. Normal bowel/bladder habits.  2) R shoulder pain. Non-traumatic. First noted a few days ago. "Felt a pop in my shoulder and it has been painful since". No extremity sensation changes or weakness. Pain worse with R shoulder movement.  3) Scrotal discomfort. Gradual onset. First noted a few d ago. No swelling. No specific aggravating or alleviating factors reported. Normal urination (baseline with known BPH. No hematuria. No sexual activity in over one month. No penile d/c. Not worried re: STI. No abd pain.   Past Surgical History:  Procedure Laterality Date  . KNEE SURGERY        OBJECTIVE:  Vitals:   11/27/20 0929  BP: (!) 143/90  Pulse: (!) 136  Resp: 20  Temp: 97.7 F (36.5 C)  TempSrc: Oral  SpO2: 98%    Clinical Course as of 11/27/20 1040  Mon Nov 27, 2020  1030 Pulse Rate(!): 136 Recheck: 102 [BH]    Clinical Course User Index [BH] Mardella Layman, MD    General appearance: alert; no distress HEENT: [place]; AT Neck: supple with FROM Resp: unlabored respirations Back:  mild tenderness over bilateral lumbar paraspinal musculature; no midline tenderness; FROM at waist Extremities: . RUE: warm with well perfused appearance; poorly localized moderate tenderness over right superior and lateral shoulder; without gross deformities; swelling: none; bruising: none; shoulder ROM: limited by reported pain; normal distal sensation CV: brisk extremity capillary refill of RUE; 2+ radial pulse of RUE. Skin: warm and dry; no visible rashes Neurologic: gait  normal; normal sensation and strength of bilateral LE Psychological: alert and cooperative; normal mood and affect  Imaging: DG Lumbar Spine Complete  Result Date: 11/27/2020 CLINICAL DATA:  Low back pain for the past 2 weeks. No known injury. EXAM: LUMBAR SPINE - COMPLETE 4+ VIEW COMPARISON:  None. FINDINGS: There are 5 non rib-bearing lumbar type vertebral bodies. Grade 1 anterolisthesis of L4 upon L5 measuring approximately 6 mm. No definitive associated pars defects. Lumbar vertebral body heights appear preserved. Mild to moderate multilevel lumbar spine DDD, worse at L4-L5 with disc space height loss, endplate irregularity and sclerosis Limited visualization of the bilateral SI joints is normal. Large colonic stool burden without evidence of enteric obstruction. Atherosclerotic plaque within the abdominal aorta. IMPRESSION: 1. No acute findings. 2. Mild-to-moderate multilevel lumbar spine DDD, worse at L4-L5. 3. Grade 1 anterolisthesis of L4 upon L5, presumably degenerative in etiology. 4.  Aortic Atherosclerosis (ICD10-I70.0). Electronically Signed   By: Simonne Come M.D.   On: 11/27/2020 10:13   DG Shoulder Right  Result Date: 11/27/2020 CLINICAL DATA:  Injured arm while lying in bed now unable to move arm. EXAM: RIGHT SHOULDER - 2+ VIEW COMPARISON:  None. FINDINGS: There is an apparent 5.1 x 3.6 cm lytic lesion involving the superolateral aspect of the humerus with cortical irregularity involving the lateral aspect of the proximal humeral metaphysis worrisome for an underlying lytic lesion with associated pathologic fracture. No definitive intra-articular extension. Suspected mild degenerative change the right glenohumeral joint with joint space loss, subchondral sclerosis and osteophytosis. There is minimal enthesopathic change involving the undersurface of the acromion. Acromioclavicular joint spaces appear preserved. No evidence of calcific tendinitis. Limited visualization of the adjacent thorax  is normal. IMPRESSION: Apparent lytic lesion involving the superolateral aspect of the humerus with cortical irregularity involving the lateral aspect of the proximal humeral metaphysis worrisome for a pathologic fracture. Further evaluation with contrast-enhanced MRI of the right shoulder is recommended. These results will be called to the ordering clinician or representative by the Radiologist Assistant, and communication documented in the PACS or [employer]. Electronically Signed   By: Simonne Come M.D.   On: 11/27/2020 10:27      No Known Allergies  Past Medical History:  Diagnosis Date  . Hypertension    Social History   Socioeconomic History  . Marital status: Single    Spouse name: Not on file  . Number of children: Not on file  . Years of education: Not on file  . Highest education level: Not on file  Occupational History  . Not on file  Tobacco Use  . Smoking status: Never Smoker  . Smokeless tobacco: Never Used  Substance and Sexual Activity  . Alcohol use: Yes    Comment: occasionally  . Drug use: No  . Sexual activity: Not on file  Other Topics Concern  . Not on file  Social History Narrative  . Not on file   Social Determinants of Health   Financial Resource Strain: Not on file  Food Insecurity: Not on file  Transportation Needs: Not on file  Physical Activity: Not  on file  Stress: Not on file  Social Connections: Not on file   Family History  Problem Relation Age of Onset  . Stroke Mother   . Cancer Father    Past Surgical History:  Procedure Laterality Date  . KNEE SURGERY        Mardella Layman, MD 11/27/20 1120

## 2020-11-27 NOTE — ED Triage Notes (Signed)
Pt presents for follow-up for low back pain from 11/23/20. States toradol shot lasted for about 2 days with pain relief and flexeril is helping with pain at night to sleep.   States pain is radiating into scrotum area.   Also c/o right shoulder pain. States heard a pop in shoulder and now can not raise it all the way.

## 2020-12-13 ENCOUNTER — Emergency Department (HOSPITAL_COMMUNITY)
Admission: EM | Admit: 2020-12-13 | Discharge: 2020-12-13 | Disposition: A | Discharge status: Home / Self Care | Payer: No Typology Code available for payment source | Attending: Emergency Medicine | Admitting: Emergency Medicine

## 2020-12-13 ENCOUNTER — Other Ambulatory Visit: Payer: Self-pay

## 2020-12-13 DIAGNOSIS — I10 Essential (primary) hypertension: Secondary | ICD-10-CM | POA: Diagnosis not present

## 2020-12-13 DIAGNOSIS — M5416 Radiculopathy, lumbar region: Secondary | ICD-10-CM

## 2020-12-13 DIAGNOSIS — R202 Paresthesia of skin: Secondary | ICD-10-CM | POA: Insufficient documentation

## 2020-12-13 DIAGNOSIS — M545 Low back pain, unspecified: Secondary | ICD-10-CM | POA: Diagnosis present

## 2020-12-13 DIAGNOSIS — Z79899 Other long term (current) drug therapy: Secondary | ICD-10-CM | POA: Diagnosis not present

## 2020-12-13 DIAGNOSIS — Z8616 Personal history of COVID-19: Secondary | ICD-10-CM | POA: Insufficient documentation

## 2020-12-13 MED ORDER — HYDROCODONE-ACETAMINOPHEN 5-325 MG PO TABS
1.0000 | ORAL_TABLET | Freq: Once | ORAL | Status: AC
  Administered 2020-12-13: 1 via ORAL
  Filled 2020-12-13: qty 1

## 2020-12-13 MED ORDER — PREDNISONE 20 MG PO TABS
60.0000 mg | ORAL_TABLET | Freq: Once | ORAL | Status: AC
  Administered 2020-12-13: 60 mg via ORAL
  Filled 2020-12-13: qty 3

## 2020-12-13 MED ORDER — PREDNISONE 20 MG PO TABS: 40.0000 mg | ORAL_TABLET | Freq: Every day | ORAL | 0 refills | Status: AC

## 2020-12-13 MED ORDER — METHOCARBAMOL 500 MG PO TABS: 500.0000 mg | ORAL_TABLET | Freq: Two times a day (BID) | ORAL | 0 refills | Status: AC

## 2020-12-13 MED ORDER — METHOCARBAMOL 500 MG PO TABS
500.0000 mg | ORAL_TABLET | Freq: Once | ORAL | Status: AC
  Administered 2020-12-13: 500 mg via ORAL
  Filled 2020-12-13: qty 1

## 2020-12-13 MED ORDER — KETOROLAC TROMETHAMINE 30 MG/ML IJ SOLN
30.0000 mg | Freq: Once | INTRAMUSCULAR | Status: AC
  Administered 2020-12-13: 30 mg via INTRAMUSCULAR
  Filled 2020-12-13: qty 1

## 2020-12-13 NOTE — ED Triage Notes (Signed)
Pt reports three weeks of sciatic nerve pain. Seen at [facility] for same and given steroid and muscle relaxer, but has run out and pain has returned/worsened on L side, making it difficult to walk. Some numbness down left leg.

## 2020-12-13 NOTE — ED Provider Notes (Signed)
[facility] EMERGENCY DEPARTMENT Provider Note   CSN: [id number] Arrival date & time: 12/13/20  1547     History Chief Complaint  Patient presents with  . Back Pain    Tracy Fritz is a 65 y.o. male.  Tracy Fritz is a 65 y.o. male with a history of hypertension, and BPH, who presents to the emergency department for continued low back pain.  Patient states for the past 3-4 weeks he has been experiencing left-sided low back pain that radiates into his left leg.  He denies any inciting trauma or injury and denies history of previous similar back pain.  He has been seen in urgent care twice for these symptoms, but feels that it is not improving.  Has also followed up with his primary care doctor.  He is scheduled for an MRI as an outpatient date but presents today due to worsening reports constant ache left low back that worsens with activity and movement and radiates down the leg.  He reports he has experienced some numbness and tingling on the outer portion of his left leg, no weakness.  Denies any loss of bowel or bladder control or saddle anesthesia.  Reports he does have some chronic difficulties with initiating urinary stream related to BPH but as long as he takes his medications for this this is well managed.  No associated abdominal pain, no fevers or chills.  Reports that he has been taking naproxen, and was prescribed a steroid and what he thought was a muscle relaxer at urgent care but reports that the steroid and muscle relaxer seem to make him feel worse.  In reviewing patient's chart it appears that he was actually prescribed a steroid as well as Levaquin with concern for possible epididymitis.  Patient reports he was having lots of myalgias and pains, suspect this was due to the Levaquin.  He did not complete the steroids.        Past Medical History:  Diagnosis Date  . Hypertension     Patient Active Problem List   Diagnosis Date Noted  . Pneumonia  due to COVID-19 virus 04/15/2019  . Essential hypertension 04/15/2019  . BPH (benign prostatic hyperplasia) 04/15/2019  . Gout 04/15/2019    Past Surgical History:  Procedure Laterality Date  . KNEE SURGERY         Family History  Problem Relation Age of Onset  . Stroke Mother   . Cancer Father     Social History   Tobacco Use  . Smoking status: Never Smoker  . Smokeless tobacco: Never Used  Substance Use Topics  . Alcohol use: Yes    Comment: occasionally  . Drug use: No    Home Medications Prior to Admission medications   Medication Sig Start Date End Date Taking? Authorizing Provider  benzonatate (TESSALON) 100 MG capsule Take 1 capsule (100 mg total) by mouth 3 (three) times daily as needed for cough. 04/20/19   Rolly Salter, MD  colchicine 0.6 MG tablet Take 0.6 mg by mouth as directed. Take 2 tablets by mouth at onset of gout attack, then 1 tablet 1 hour later for a total of 3 tablets    [provider]  cyclobenzaprine (FLEXERIL) 5 MG tablet Take 1 tablet (5 mg total) by mouth 2 (two) times daily as needed for muscle spasms. 11/23/20   Valinda Hoar, NP  famotidine (PEPCID) 20 MG tablet Take 1 tablet (20 mg total) by mouth daily. 04/20/19  Rolly Salter, MD  feeding supplement, ENSURE ENLIVE, (ENSURE ENLIVE) LIQD Take 237 mLs by mouth 2 (two) times daily between meals. 04/20/19   Rolly Salter, MD  levofloxacin (LEVAQUIN) 500 MG tablet Take 1 tablet (500 mg total) by mouth daily. 11/27/20   Mardella Layman, MD  naproxen (NAPROSYN) 500 MG tablet Take 1 tablet (500 mg total) by mouth 2 (two) times daily. 11/23/20   White, Elita Boone, NP  predniSONE (DELTASONE) 20 MG tablet Take 2 tablets (40 mg total) by mouth daily. 11/27/20   Mardella Layman, MD  terazosin (HYTRIN) 2 MG capsule Take 4 mg by mouth at bedtime.    [provider]    Allergies    Patient has no known allergies.  Review of Systems   Review of Systems  Constitutional: Negative for  chills and fever.  HENT: Negative.   Respiratory: Negative for shortness of breath.   Cardiovascular: Negative for chest pain.  Gastrointestinal: Negative for abdominal pain, constipation, diarrhea, nausea and vomiting.  Genitourinary: Negative for dysuria, flank pain, frequency and hematuria.  Musculoskeletal: Positive for back pain. Negative for arthralgias, gait problem, joint swelling, myalgias and neck pain.  Skin: Negative for color change, rash and wound.  Neurological: Negative for weakness and numbness.  All other systems reviewed and are negative.   Physical Exam Updated Vital Signs BP (!) 125/94 (BP Location: Left Arm)   Pulse (!) 105   Temp 98.3 F (36.8 C)   Resp 16   Ht 5\' 8"  (1.727 m)   Wt 108.9 kg   SpO2 100%   BMI 36.49 kg/m   Physical Exam Vitals and nursing note reviewed.  Constitutional:      General: He is not in acute distress.    Appearance: He is well-developed. He is not diaphoretic.  HENT:     Head: Atraumatic.  Eyes:     General:        Right eye: No discharge.        Left eye: No discharge.  Cardiovascular:     Rate and Rhythm: Normal rate and regular rhythm.     Pulses:          Radial pulses are 2+ on the right side and 2+ on the left side.       Dorsalis pedis pulses are 2+ on the right side and 2+ on the left side.       Posterior tibial pulses are 2+ on the right side and 2+ on the left side.     Heart sounds: Normal heart sounds.  Pulmonary:     Effort: Pulmonary effort is normal. No respiratory distress.     Breath sounds: Normal breath sounds.  Abdominal:     General: Bowel sounds are normal. There is no distension.     Palpations: Abdomen is soft. There is no mass.     Tenderness: There is no abdominal tenderness. There is no guarding.     Comments: Abdomen soft, nondistended, nontender to palpation in all quadrants without guarding or peritoneal signs, no CVA tenderness bilaterally  Musculoskeletal:     Cervical back: Neck  supple.     Comments: Tenderness to palpation over left paraspinal muscles,  pain made worse with range of motion of the lower extremities, negative straight leg raise  Skin:    General: Skin is warm and dry.     Capillary Refill: Capillary refill takes less than 2 seconds.  Neurological:     Mental Status: He is  alert and oriented to person, place, and time.     Comments: Alert, clear speech, following commands. Moving all extremities without difficulty. Bilateral lower extremities with 5/5 strength in proximal and distal muscle groups and with dorsi and plantar flexion. Sensation intact in bilateral lower extremities.  Does report some mildly decreased sensation noted over the left lateral lower leg  2+ patellar DTRs bilaterally. Ambulatory with steady gait  Psychiatric:        Behavior: Behavior normal.     ED Results / Procedures / Treatments   Labs (all labs ordered are listed, but only abnormal results are displayed) Labs Reviewed - No data to display  EKG None  Radiology No results found.  Procedures Procedures   Medications Ordered in ED Medications - No data to display  ED Course  I have reviewed the triage vital signs and the nursing notes.  Pertinent labs & imaging results that were available during my care of the patient were reviewed by me and considered in my medical decision making (see chart for details).    MDM Rules/Calculators/A&P                          Patient presents with continued left lower back pain that radiates into his leg with some decreased sensation over the left lateral leg, presentation seems most consistent with lumbar radiculopathy, suspect patient has a herniated disc.  He does not have any red flag symptoms and aside from mild decrease sensation over the left lateral leg, no other neurologic deficits.  Do not feel that patient needs emergent MRI, he is scheduled for an outpatient MRI on Monday.  I reviewed medications he was treated  with urgent care, suspect that the symptoms he was having previously were most likely brought on by Levaquin, so will try using prednisone again as well as continued NSAIDs and muscle relaxer, patient had improvement in his symptoms with treatment here in the ED and was able to ambulate independently.  We will have him continue these medications and follow-up closely with his PCP.  Return precautions discussed.  Patient expresses understanding and agreement with plan.  Discharged home in good condition.    Final Clinical Impression(s) / ED Diagnoses Final diagnoses:  Lumbar radiculopathy    Rx / DC Orders ED Discharge Orders         Ordered    predniSONE (DELTASONE) 20 MG tablet  Daily        12/13/20 2027    methocarbamol (ROBAXIN) 500 MG tablet  2 times daily        12/13/20 2027           Dartha Lodge, PA-C 12/14/20 0120    Maia Plan, MD 12/14/20 [phone]

## 2020-12-13 NOTE — Discharge Instructions (Addendum)
Does have HOME INSTRUCTIONS Self - care:  The application of heat can help soothe the pain.  Maintaining your daily activities, including walking (this is encouraged), as it will help you get better faster than just staying in bed. Do not life, push, pull anything more than 10 pounds for the next week. I am attaching back exercises that you can do at home to help facilitate your recovery.   Back Exercises - I have attached a handout on back exercises that can be done at home to help facilitate your recovery.   Medications are also useful to help with pain control.   Acetaminophen.  This medication is generally safe, and found over the counter. Take as directed for your age. You should not take more than 8 of the extra strength (500mg ) pills a day (max dose is 4000mg  total OVER one day)  Non steroidal anti inflammatory: This includes medications including Ibuprofen, naproxen and Mobic; These medications help both pain and swelling and are very useful in treating back pain.  They should be taken with food, as they can cause stomach upset, and more seriously, stomach bleeding. Do not combine the medications.   Lidocaine Patch: Salon Pas lidocaine patches (blue and silver box) can be purchased over the counter and worn for 12 hours for local pain relief   Muscle relaxants:  These medications can help with muscle tightness that is a cause of lower back pain.  Most of these medications can cause drowsiness, and it is not safe to drive or use dangerous machinery while taking them. They are primarily helpful when taken at night before sleep.  Prednisone - This is an oral steroid.  This medication is best taken with food in the morning.  Please note that this medication can cause anxiety, mood swings, muscle fatigue, increased hunger, weight gain (sodium/fluid retention), poor sleep as well as other symptoms. If you are a diabetic, please monitor your blood sugars at home as this medication can increase your  blood sugars. Call your pharmacist if you have any questions.  Follow-up for MRI on Monday as planned.  Be aware that if you develop new symptoms, such as a fever, leg weakness, difficulty with or loss of control of your urine or bowels, abdominal pain, or more severe pain, you will need to seek medical attention and/or return to the Emergency department. Additional Information:  Your vital signs today were: BP (!) 151/90 (BP Location: Right Arm)   Pulse 100   Temp 98.3 F (36.8 C)   Resp 20   Ht 5\' 8"  (1.727 m)   Wt 108.9 kg   SpO2 100%   BMI 36.49 kg/m  If your blood pressure (BP) was elevated above 135/85 this visit, please have this repeated by your doctor within one month. ---------------

## 2021-04-30 IMAGING — DX DG LUMBAR SPINE COMPLETE 4+V
5 series · 5 of 5 positions shown · non-contrast
Comparison: None.

CLINICAL DATA: Low back pain for the past 2 weeks. No known injury.

EXAM:
LUMBAR SPINE - COMPLETE 4+ VIEW

[l-spine ap]
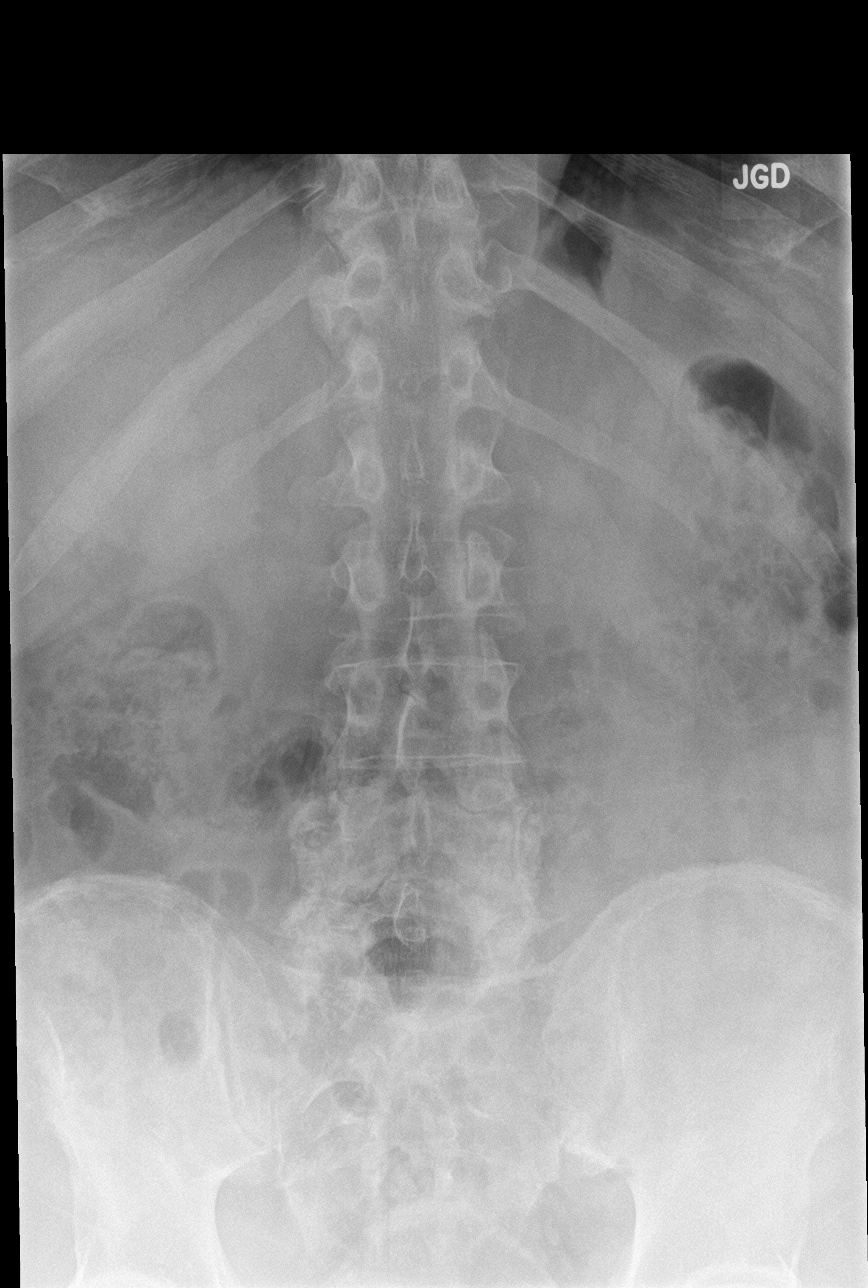

[l-spine obl (1 of 2)]
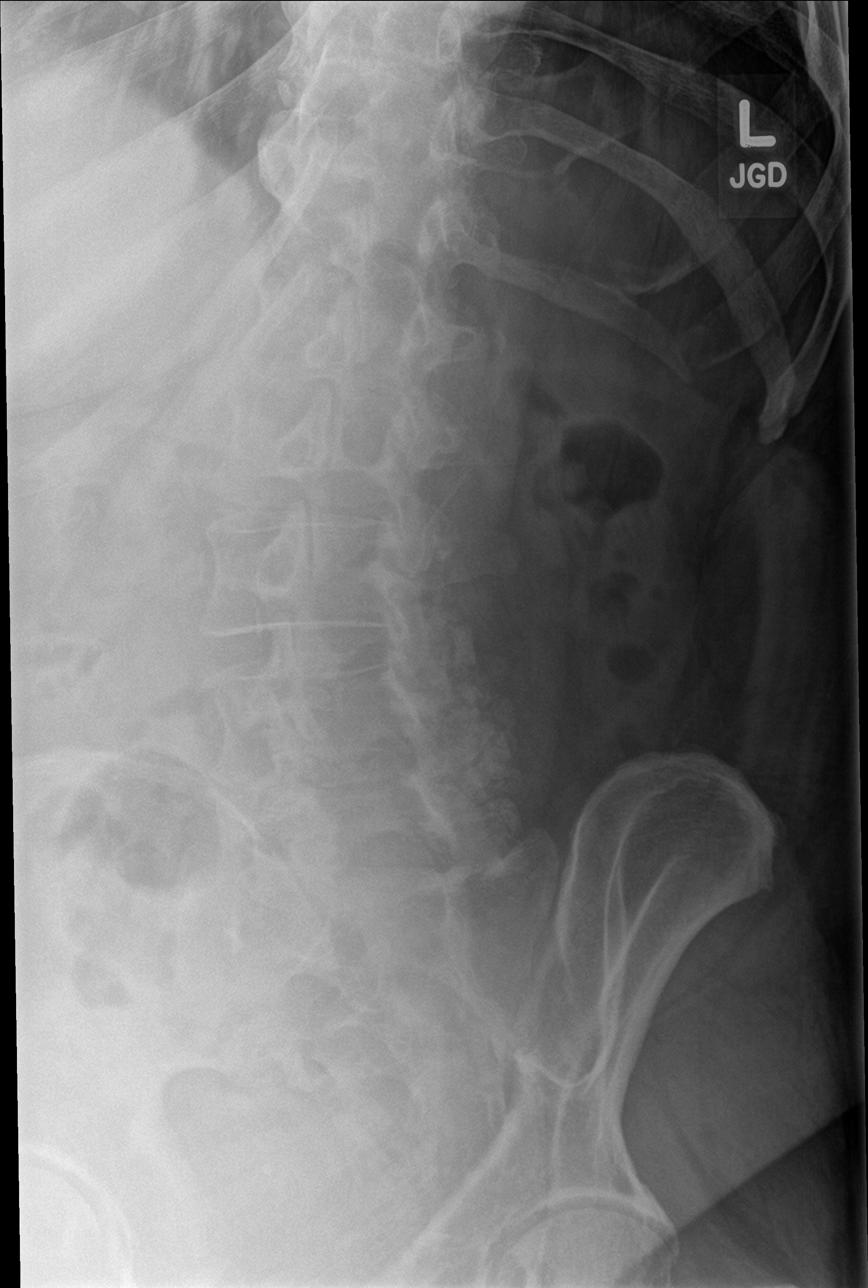

[l-spine obl (2 of 2)]
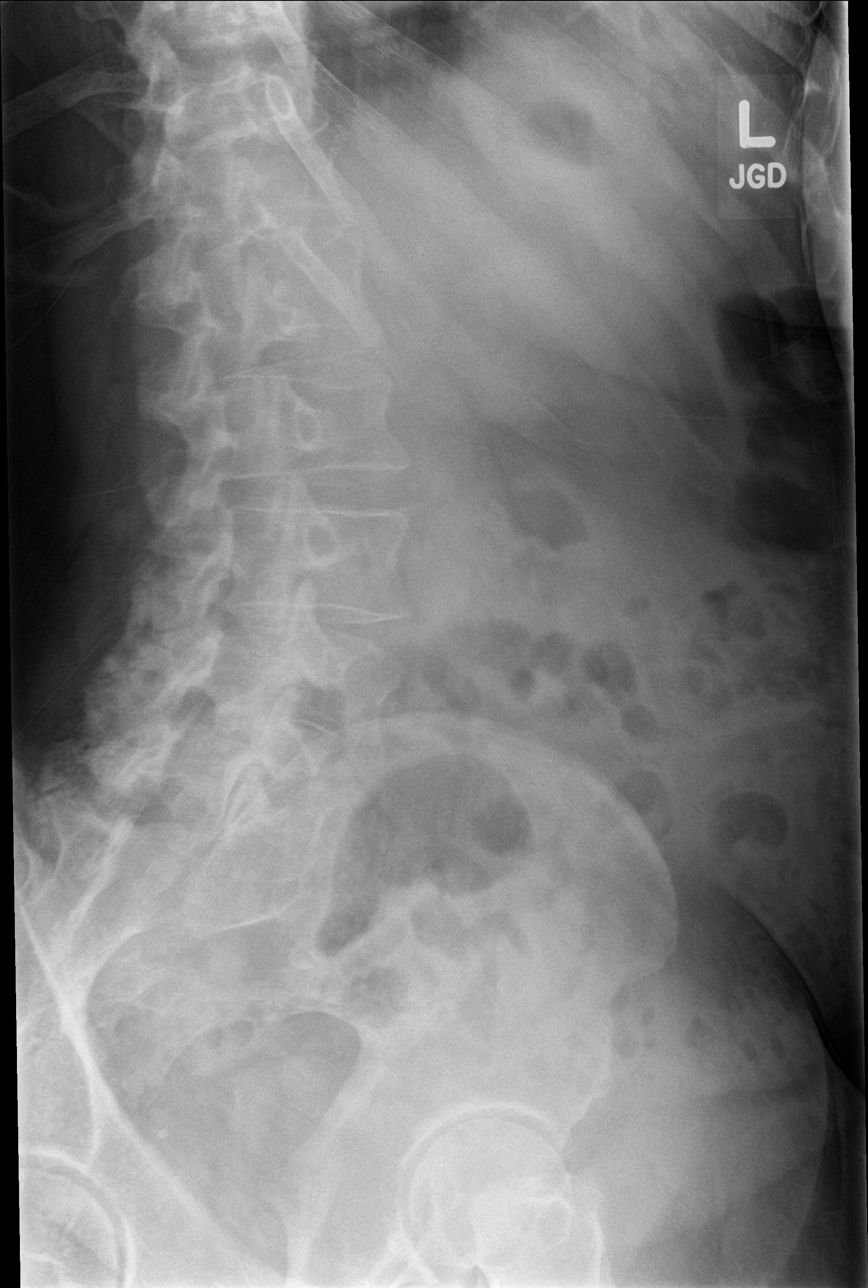

[l-spine lat]
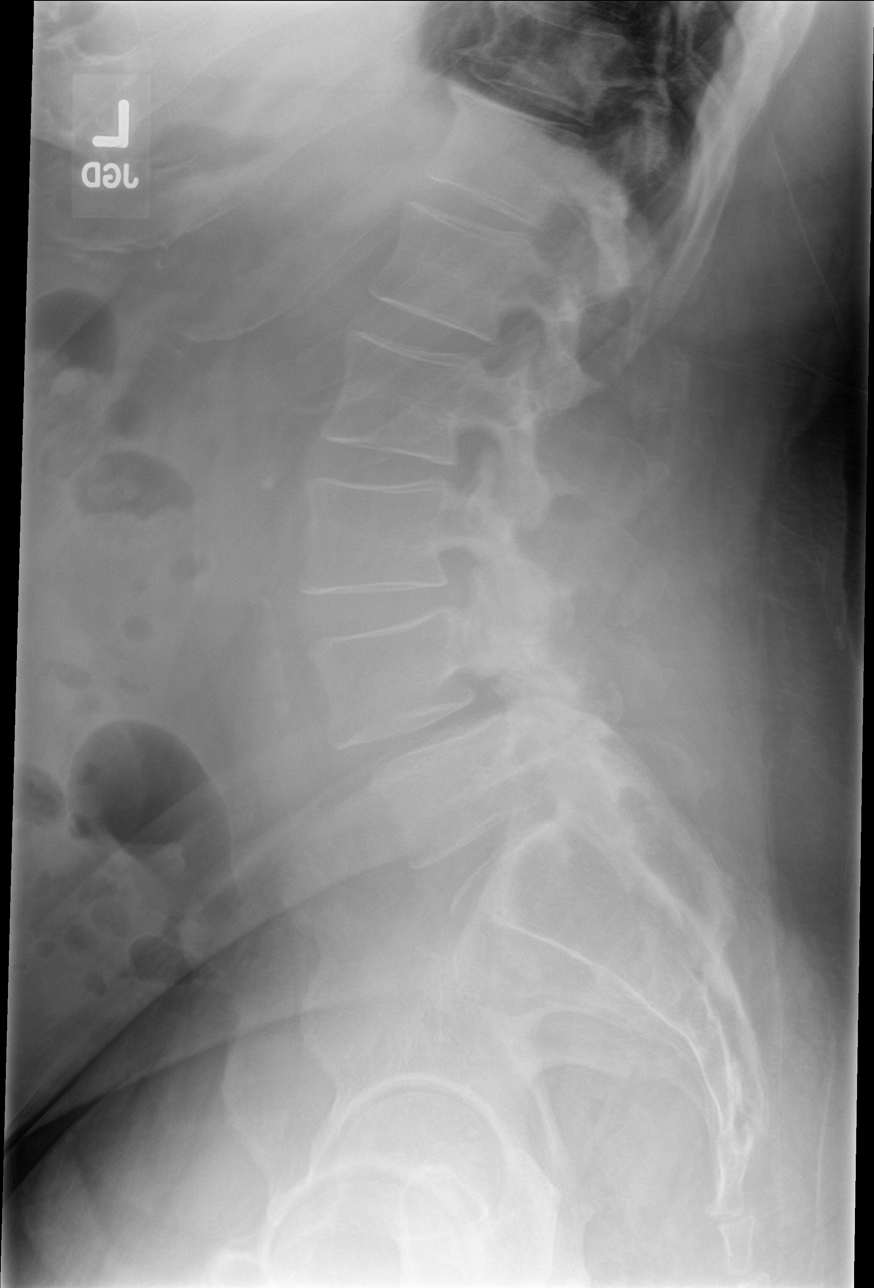

[l-spine spot]
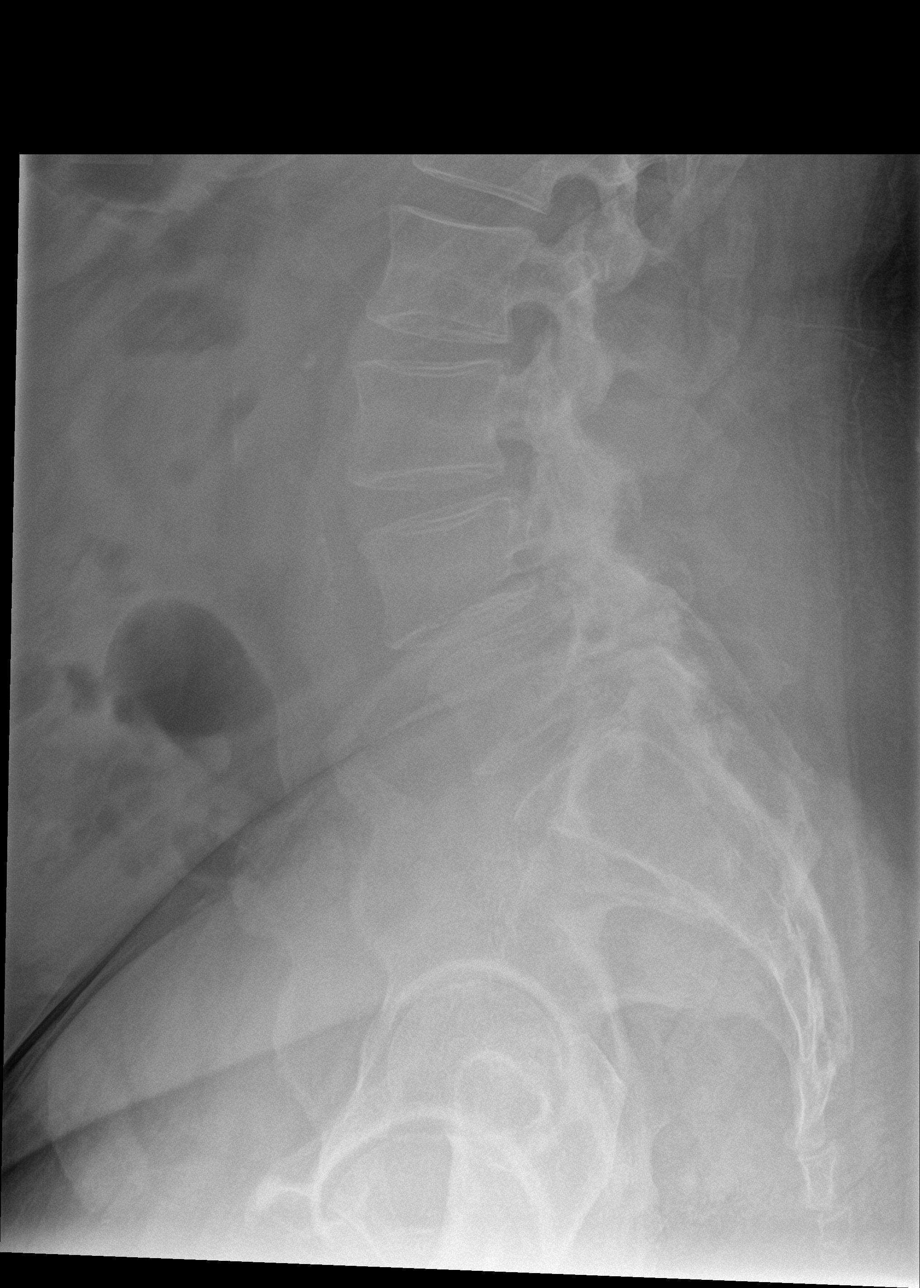

[5 of 5 positions shown; findings below may reference images not displayed]

FINDINGS: There are 5 non rib-bearing lumbar type vertebral bodies.

Grade 1 anterolisthesis of L4 upon L5 measuring approximately 6 mm.
No definitive associated pars defects.

Lumbar vertebral body heights appear preserved.

Mild to moderate multilevel lumbar spine DDD, worse at L4-L5 with
disc space height loss, endplate irregularity and sclerosis

Limited visualization of the bilateral SI joints is normal. Large
colonic stool burden without evidence of enteric obstruction.
Atherosclerotic plaque within the abdominal aorta.
IMPRESSION: 1. No acute findings.
2. Mild-to-moderate multilevel lumbar spine DDD, worse at L4-L5.
3. Grade 1 anterolisthesis of L4 upon L5, presumably degenerative in
etiology.
4.  Aortic Atherosclerosis ([id number]).
